# Patient Record
Sex: Female | Born: 1995 | Race: Black or African American | Hispanic: No | Marital: Single | Smoking: Never smoker
Health system: Southern US, Community
[De-identification: ages and names within clinical notes are randomized; demographics above are authoritative.]

## PROBLEM LIST (undated history)

## (undated) DIAGNOSIS — J189 Pneumonia, unspecified organism: Secondary | ICD-10-CM

## (undated) DIAGNOSIS — J438 Other emphysema: Secondary | ICD-10-CM

## (undated) DIAGNOSIS — M349 Systemic sclerosis, unspecified: Secondary | ICD-10-CM

## (undated) DIAGNOSIS — J449 Chronic obstructive pulmonary disease, unspecified: Secondary | ICD-10-CM

---
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
## 2019-11-01 ENCOUNTER — Other Ambulatory Visit: Payer: Self-pay

## 2019-11-01 ENCOUNTER — Inpatient Hospital Stay (HOSPITAL_COMMUNITY)
Admission: EM | Admit: 2019-11-01 | Discharge: 2019-11-14 | DRG: 871 | Disposition: A | Discharge status: Home / Self Care | Payer: BC Managed Care – PPO | Attending: Internal Medicine | Admitting: Internal Medicine

## 2019-11-01 ENCOUNTER — Emergency Department (HOSPITAL_COMMUNITY): Payer: BC Managed Care – PPO

## 2019-11-01 ENCOUNTER — Encounter (HOSPITAL_COMMUNITY): Payer: Self-pay | Admitting: Emergency Medicine

## 2019-11-01 DIAGNOSIS — K567 Ileus, unspecified: Secondary | ICD-10-CM

## 2019-11-01 DIAGNOSIS — Z6822 Body mass index (BMI) 22.0-22.9, adult: Secondary | ICD-10-CM

## 2019-11-01 DIAGNOSIS — E876 Hypokalemia: Secondary | ICD-10-CM | POA: Diagnosis not present

## 2019-11-01 DIAGNOSIS — N141 Nephropathy induced by other drugs, medicaments and biological substances: Secondary | ICD-10-CM | POA: Diagnosis not present

## 2019-11-01 DIAGNOSIS — Z8739 Personal history of other diseases of the musculoskeletal system and connective tissue: Secondary | ICD-10-CM

## 2019-11-01 DIAGNOSIS — T508X5A Adverse effect of diagnostic agents, initial encounter: Secondary | ICD-10-CM | POA: Diagnosis not present

## 2019-11-01 DIAGNOSIS — D638 Anemia in other chronic diseases classified elsewhere: Secondary | ICD-10-CM | POA: Diagnosis not present

## 2019-11-01 DIAGNOSIS — R109 Unspecified abdominal pain: Secondary | ICD-10-CM

## 2019-11-01 DIAGNOSIS — J438 Other emphysema: Secondary | ICD-10-CM | POA: Diagnosis present

## 2019-11-01 DIAGNOSIS — E875 Hyperkalemia: Secondary | ICD-10-CM | POA: Diagnosis not present

## 2019-11-01 DIAGNOSIS — B370 Candidal stomatitis: Secondary | ICD-10-CM | POA: Diagnosis not present

## 2019-11-01 DIAGNOSIS — Z20822 Contact with and (suspected) exposure to covid-19: Secondary | ICD-10-CM | POA: Diagnosis present

## 2019-11-01 DIAGNOSIS — I7300 Raynaud's syndrome without gangrene: Secondary | ICD-10-CM | POA: Diagnosis present

## 2019-11-01 DIAGNOSIS — M3481 Systemic sclerosis with lung involvement: Secondary | ICD-10-CM | POA: Diagnosis present

## 2019-11-01 DIAGNOSIS — R0902 Hypoxemia: Secondary | ICD-10-CM | POA: Diagnosis present

## 2019-11-01 DIAGNOSIS — E872 Acidosis: Secondary | ICD-10-CM | POA: Diagnosis not present

## 2019-11-01 DIAGNOSIS — R0602 Shortness of breath: Secondary | ICD-10-CM | POA: Diagnosis not present

## 2019-11-01 DIAGNOSIS — E440 Moderate protein-calorie malnutrition: Secondary | ICD-10-CM | POA: Diagnosis present

## 2019-11-01 DIAGNOSIS — E8770 Fluid overload, unspecified: Secondary | ICD-10-CM | POA: Diagnosis not present

## 2019-11-01 DIAGNOSIS — K224 Dyskinesia of esophagus: Secondary | ICD-10-CM | POA: Diagnosis not present

## 2019-11-01 DIAGNOSIS — N179 Acute kidney failure, unspecified: Secondary | ICD-10-CM

## 2019-11-01 DIAGNOSIS — A419 Sepsis, unspecified organism: Principal | ICD-10-CM | POA: Diagnosis present

## 2019-11-01 DIAGNOSIS — M349 Systemic sclerosis, unspecified: Secondary | ICD-10-CM | POA: Diagnosis present

## 2019-11-01 DIAGNOSIS — J852 Abscess of lung without pneumonia: Secondary | ICD-10-CM | POA: Diagnosis present

## 2019-11-01 DIAGNOSIS — J850 Gangrene and necrosis of lung: Secondary | ICD-10-CM | POA: Diagnosis present

## 2019-11-01 DIAGNOSIS — J851 Abscess of lung with pneumonia: Secondary | ICD-10-CM | POA: Diagnosis present

## 2019-11-01 HISTORY — DX: Pneumonia, unspecified organism: J18.9

## 2019-11-01 HISTORY — DX: Chronic obstructive pulmonary disease, unspecified: J44.9

## 2019-11-01 HISTORY — DX: Systemic sclerosis, unspecified: M34.9

## 2019-11-01 LAB — BASIC METABOLIC PANEL
Anion gap: 13 (ref 5–15)
BUN: 10 mg/dL (ref 6–20)
CO2: 21 mmol/L — ABNORMAL LOW (ref 22–32)
Calcium: 8.7 mg/dL — ABNORMAL LOW (ref 8.9–10.3)
Chloride: 104 mmol/L (ref 98–111)
Creatinine, Ser: 0.68 mg/dL (ref 0.44–1.00)
GFR calc Af Amer: >60 mL/min (ref >60)
GFR calc non Af Amer: >60 mL/min (ref >60)
Glucose, Bld: 88 mg/dL (ref 70–99)
Potassium: 3.8 mmol/L (ref 3.5–5.1)
Sodium: 138 mmol/L (ref 135–145)

## 2019-11-01 LAB — CBC
HCT: 34.7 % — ABNORMAL LOW (ref 36.0–46.0)
Hemoglobin: 11.1 g/dL — ABNORMAL LOW (ref 12.0–15.0)
MCH: 29.1 pg (ref 26.0–34.0)
MCHC: 32 g/dL (ref 30.0–36.0)
MCV: 91.1 fL (ref 80.0–100.0)
Platelets: 343 10*3/uL (ref 150–400)
RBC: 3.81 MIL/uL — ABNORMAL LOW (ref 3.87–5.11)
RDW: 14.1 % (ref 11.5–15.5)
WBC: 31.2 10*3/uL — ABNORMAL HIGH (ref 4.0–10.5)
nRBC: 0 % (ref 0.0–0.2)

## 2019-11-01 LAB — TROPONIN I (HIGH SENSITIVITY)
Troponin I (High Sensitivity): 18 ng/L — ABNORMAL HIGH (ref <18)
Troponin I (High Sensitivity): 43 ng/L — ABNORMAL HIGH (ref <18)

## 2019-11-01 LAB — I-STAT BETA HCG BLOOD, ED (MC, WL, AP ONLY): I-stat hCG, quantitative: 37.5 m[IU]/mL — ABNORMAL HIGH (ref <5)

## 2019-11-01 MED ORDER — HYDROCOD POLST-CPM POLST ER 10-8 MG/5ML PO SUER
5.0000 mL | Freq: Once | ORAL | Status: AC
  Administered 2019-11-01: 5 mL via ORAL
  Filled 2019-11-01: qty 5

## 2019-11-01 MED ORDER — SODIUM CHLORIDE 0.9% FLUSH: 3.0000 mL | Freq: Once | INTRAVENOUS | Status: DC

## 2019-11-01 NOTE — ED Triage Notes (Signed)
Pt in w/mom, c/o R sided lung pain and sob. Pt went to [facility] hospital 2 wks ago, had CT Angio done, and they found significant R lower lobe bullous emphysema. States past few days, pain is more unbearable, harder to breathe

## 2019-11-01 NOTE — ED Provider Notes (Signed)
[facility] EMERGENCY DEPARTMENT Provider Note   CSN: [id number] Arrival date & time: 11/01/19  1216     History Chief Complaint  Patient presents with  . Shortness of Breath  . Chest Pain    Ellen Skinner is a 24 y.o. female.  Patient with history of scleroderma presents with shortness of breath and cough since yesterday. 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 She developed a cough yesterday that has been persistent which prompted ED evaluation today. No fever, nausea, vomiting. No congestion. She is COVID vaccinated.   The history is provided by the patient. No language interpreter was used.  Shortness of Breath Associated symptoms: chest pain (Pleuritic)   Associated symptoms: no abdominal pain, no fever, no sore throat and no vomiting   Chest Pain Associated symptoms: shortness of breath   Associated symptoms: no abdominal pain, no fever, no nausea, no vomiting and no weakness        History reviewed. No pertinent past medical history.  There are no problems to display for this patient.   History reviewed. No pertinent surgical history.   OB History   No obstetric history on file.     No family history on file.  Social History   Tobacco Use  . Smoking status: Never Smoker  . Smokeless tobacco: Never Used  Substance Use Topics  . Alcohol use: Never  . Drug use: Never    Home Medications Prior to Admission medications   Not on File    Allergies    Patient has no allergy information on record.  Review of Systems   Review of Systems  Constitutional: Negative for chills and fever.  HENT: Negative.  Negative for congestion and sore throat.   Respiratory: Positive for shortness of breath.   Cardiovascular: Positive for chest pain (Pleuritic).  Gastrointestinal: Negative for abdominal pain, nausea and vomiting.  Musculoskeletal: Negative for myalgias.    Neurological: Negative for weakness and light-headedness.    Physical Exam Updated Vital Signs BP 117/66 (BP Location: Left Arm)   Pulse (!) 113   Temp 98.9 F (37.2 C) (Oral)   Resp 16   Wt 53.1 kg   LMP 10/05/2019   SpO2 96%   Physical Exam Vitals and nursing note reviewed.  Constitutional:      General: She is not in acute distress.    Appearance: She is well-developed.  HENT:     Head: Normocephalic.  Cardiovascular:     Rate and Rhythm: Regular rhythm. Tachycardia present.     Heart sounds: No murmur heard.   Pulmonary:     Effort: Pulmonary effort is normal.     Comments: Decreased air movement on right middle and lower lobes. Actively coughing with deep respirations. Chest:     Chest wall: No tenderness.  Abdominal:     Palpations: Abdomen is soft.     Tenderness: There is no abdominal tenderness.  Musculoskeletal:     Cervical back: Normal range of motion and neck supple.     Right lower leg: No edema.     Left lower leg: No edema.  Skin:    General: Skin is warm and dry.  Neurological:     General: No focal deficit present.     Mental Status: She is alert.     ED Results / Procedures / Treatments   Labs (all labs ordered are listed, but only abnormal results are displayed) Labs Reviewed  BASIC METABOLIC PANEL - Abnormal; Notable for the following components:      Result Value   CO2 21 (*)    Calcium 8.7 (*)    All other components within normal limits  CBC - Abnormal; Notable for the following components:   WBC 31.2 (*)    RBC 3.81 (*)    Hemoglobin 11.1 (*)    HCT 34.7 (*)    All other components within normal limits  I-STAT BETA HCG BLOOD, ED ([facility], [facility], [facility] ONLY) - Abnormal; Notable for the following components:   I-stat hCG, quantitative 37.5 (*)    All other components within normal limits  TROPONIN I (HIGH SENSITIVITY) - Abnormal; Notable for the following components:   Troponin I (High Sensitivity) 18 (*)    All other components within  normal limits  TROPONIN I (HIGH SENSITIVITY) - Abnormal; Notable for the following components:   Troponin I (High Sensitivity) 43 (*)    All other components within normal limits    EKG EKG Interpretation  Date/Time:  Monday November 01 2019 12:27:12 EDT Ventricular Rate:  114 PR Interval:  122 QRS Duration: 70 QT Interval:  316 QTC Calculation: 435 R Axis:   79 Text Interpretation: Sinus tachycardia Otherwise normal ECG No old tracing to compare Confirmed by Jacalyn Lefevre [phone]) on 11/01/2019 10:21:16 PM   Radiology DG Chest 2 View  Result Date: 11/01/2019 CLINICAL DATA:  Shortness of breath and chest pain EXAM: CHEST - 2 VIEW COMPARISON:  None. FINDINGS: Cardiac shadows within normal limits. The left lung is well aerated and clear. Right lung demonstrates a large cavitary lesion in the right lower lobe with air-fluid level identified. Some adjacent atelectatic changes are noted. No pneumothorax is seen. No bony abnormality is noted. IMPRESSION: Changes most consistent with a cavitary pneumonia/abscess. CT with contrast is recommended for further delineation. Electronically Signed   By: Alcide Clever M.D.   On: 11/01/2019 12:51    Procedures Procedures (including critical care time)  Medications Ordered in ED Medications  sodium chloride flush (NS) 0.9 % injection 3 mL (3 mLs Intravenous Not Given 11/01/19 2215)  chlorpheniramine-HYDROcodone (TUSSIONEX) 10-8 MG/5ML suspension 5 mL (has no administration in time range)    ED Course  I have reviewed the triage vital signs and the nursing notes.  Pertinent labs & imaging results that were available during my care of the patient were reviewed by me and considered in my medical decision making (see chart for details).    MDM Rules/Calculators/A&P                           Patient to ED with worsening SOB, new onset cough, recent diagnosis of bullous emphysema felt secondary to h/o scleroderma. She denies fever.   CXR showing  cavitary lesion. Broad spectrum antibiotics started (Vanc, Rocephin). Chart reviewed. Comparison to previous CXR raises concern for worsening condition. CT ordered and shows large cavitary process involving majority of the right lower lobe and most of the right middle lobe. There are multiple internal fluid levels within the cavitary process which appears to localize to the lung parenchyma as opposed to the pleural space. Suspect necrotizing pneumonia with probable pulmonary abscess. There is dense consolidation within the right middle and lower lobes.  Cough I simproved with Tussionex and Albuterol. She does desaturate while ambulating to 89%.   Given worsening condition/SOB, hypoxia on exertion, significant lung infection, likely necrotizing PNA, patient to be admitted for further  treatment and stabilization. Patient and parent updated.   Final Clinical Impression(s) / ED Diagnoses Final diagnoses:  None   1. Necrotizing PNA 2. Hypoxia   Rx / DC Orders ED Discharge Orders    None       Danne Harbor 11/02/19 0615    Jacalyn Lefevre, MD 11/03/19 [phone]

## 2019-11-02 ENCOUNTER — Encounter (HOSPITAL_COMMUNITY): Payer: Self-pay | Admitting: Internal Medicine

## 2019-11-02 ENCOUNTER — Emergency Department (HOSPITAL_COMMUNITY): Payer: BC Managed Care – PPO

## 2019-11-02 DIAGNOSIS — M349 Systemic sclerosis, unspecified: Secondary | ICD-10-CM | POA: Diagnosis present

## 2019-11-02 DIAGNOSIS — N179 Acute kidney failure, unspecified: Secondary | ICD-10-CM | POA: Diagnosis not present

## 2019-11-02 DIAGNOSIS — J851 Abscess of lung with pneumonia: Secondary | ICD-10-CM | POA: Diagnosis present

## 2019-11-02 DIAGNOSIS — K567 Ileus, unspecified: Secondary | ICD-10-CM | POA: Diagnosis not present

## 2019-11-02 DIAGNOSIS — I7300 Raynaud's syndrome without gangrene: Secondary | ICD-10-CM | POA: Diagnosis present

## 2019-11-02 DIAGNOSIS — T508X5A Adverse effect of diagnostic agents, initial encounter: Secondary | ICD-10-CM | POA: Diagnosis not present

## 2019-11-02 DIAGNOSIS — K224 Dyskinesia of esophagus: Secondary | ICD-10-CM | POA: Diagnosis not present

## 2019-11-02 DIAGNOSIS — E875 Hyperkalemia: Secondary | ICD-10-CM | POA: Diagnosis not present

## 2019-11-02 DIAGNOSIS — Z8739 Personal history of other diseases of the musculoskeletal system and connective tissue: Secondary | ICD-10-CM

## 2019-11-02 DIAGNOSIS — R0602 Shortness of breath: Secondary | ICD-10-CM | POA: Diagnosis present

## 2019-11-02 DIAGNOSIS — A419 Sepsis, unspecified organism: Secondary | ICD-10-CM | POA: Diagnosis present

## 2019-11-02 DIAGNOSIS — R0902 Hypoxemia: Secondary | ICD-10-CM | POA: Diagnosis present

## 2019-11-02 DIAGNOSIS — E872 Acidosis: Secondary | ICD-10-CM | POA: Diagnosis not present

## 2019-11-02 DIAGNOSIS — M3481 Systemic sclerosis with lung involvement: Secondary | ICD-10-CM | POA: Diagnosis present

## 2019-11-02 DIAGNOSIS — J850 Gangrene and necrosis of lung: Secondary | ICD-10-CM

## 2019-11-02 DIAGNOSIS — N141 Nephropathy induced by other drugs, medicaments and biological substances: Secondary | ICD-10-CM | POA: Diagnosis not present

## 2019-11-02 DIAGNOSIS — J438 Other emphysema: Secondary | ICD-10-CM

## 2019-11-02 DIAGNOSIS — E8770 Fluid overload, unspecified: Secondary | ICD-10-CM | POA: Diagnosis not present

## 2019-11-02 DIAGNOSIS — Z6822 Body mass index (BMI) 22.0-22.9, adult: Secondary | ICD-10-CM | POA: Diagnosis not present

## 2019-11-02 DIAGNOSIS — E440 Moderate protein-calorie malnutrition: Secondary | ICD-10-CM | POA: Diagnosis not present

## 2019-11-02 DIAGNOSIS — E876 Hypokalemia: Secondary | ICD-10-CM | POA: Diagnosis not present

## 2019-11-02 DIAGNOSIS — J189 Pneumonia, unspecified organism: Secondary | ICD-10-CM

## 2019-11-02 DIAGNOSIS — J852 Abscess of lung without pneumonia: Secondary | ICD-10-CM | POA: Diagnosis present

## 2019-11-02 DIAGNOSIS — D638 Anemia in other chronic diseases classified elsewhere: Secondary | ICD-10-CM | POA: Diagnosis not present

## 2019-11-02 DIAGNOSIS — Z20822 Contact with and (suspected) exposure to covid-19: Secondary | ICD-10-CM | POA: Diagnosis present

## 2019-11-02 DIAGNOSIS — B370 Candidal stomatitis: Secondary | ICD-10-CM | POA: Diagnosis not present

## 2019-11-02 LAB — HIV ANTIBODY (ROUTINE TESTING W REFLEX): HIV Screen 4th Generation wRfx: NONREACTIVE

## 2019-11-02 LAB — BASIC METABOLIC PANEL
Anion gap: 9 (ref 5–15)
BUN: 9 mg/dL (ref 6–20)
CO2: 22 mmol/L (ref 22–32)
Calcium: 8.3 mg/dL — ABNORMAL LOW (ref 8.9–10.3)
Chloride: 103 mmol/L (ref 98–111)
Creatinine, Ser: 0.76 mg/dL (ref 0.44–1.00)
GFR calc Af Amer: >60 mL/min (ref >60)
GFR calc non Af Amer: >60 mL/min (ref >60)
Glucose, Bld: 121 mg/dL — ABNORMAL HIGH (ref 70–99)
Potassium: 4.1 mmol/L (ref 3.5–5.1)
Sodium: 134 mmol/L — ABNORMAL LOW (ref 135–145)

## 2019-11-02 LAB — CBC
HCT: 34.2 % — ABNORMAL LOW (ref 36.0–46.0)
Hemoglobin: 10.9 g/dL — ABNORMAL LOW (ref 12.0–15.0)
MCH: 29.7 pg (ref 26.0–34.0)
MCHC: 31.9 g/dL (ref 30.0–36.0)
MCV: 93.2 fL (ref 80.0–100.0)
Platelets: 357 10*3/uL (ref 150–400)
RBC: 3.67 MIL/uL — ABNORMAL LOW (ref 3.87–5.11)
RDW: 14.1 % (ref 11.5–15.5)
WBC: 29.7 10*3/uL — ABNORMAL HIGH (ref 4.0–10.5)
nRBC: 0 % (ref 0.0–0.2)

## 2019-11-02 LAB — HCG, QUANTITATIVE, PREGNANCY: hCG, Beta Chain, Quant, S: 4 m[IU]/mL (ref <5)

## 2019-11-02 LAB — MRSA PCR SCREENING: MRSA by PCR: NEGATIVE

## 2019-11-02 LAB — EXPECTORATED SPUTUM ASSESSMENT W GRAM STAIN, RFLX TO RESP C

## 2019-11-02 LAB — SARS CORONAVIRUS 2 BY RT PCR (HOSPITAL ORDER, PERFORMED IN ~~LOC~~ HOSPITAL LAB): SARS Coronavirus 2: NEGATIVE

## 2019-11-02 LAB — LACTIC ACID, PLASMA: Lactic Acid, Venous: 1.7 mmol/L (ref 0.5–1.9)

## 2019-11-02 MED ORDER — ENOXAPARIN SODIUM 40 MG/0.4ML ~~LOC~~ SOLN
40.0000 mg | Freq: Every day | SUBCUTANEOUS | Status: DC
  Filled 2019-11-02: qty 0.4

## 2019-11-02 MED ORDER — ALBUTEROL SULFATE HFA 108 (90 BASE) MCG/ACT IN AERS
2.0000 | INHALATION_SPRAY | RESPIRATORY_TRACT | Status: DC | PRN
  Administered 2019-11-02: 2 via RESPIRATORY_TRACT
  Filled 2019-11-02: qty 6.7

## 2019-11-02 MED ORDER — PIPERACILLIN-TAZOBACTAM 3.375 G IVPB
3.3750 g | Freq: Three times a day (TID) | INTRAVENOUS | Status: DC
  Administered 2019-11-02 – 2019-11-03 (×4): 3.375 g via INTRAVENOUS
  Filled 2019-11-02 (×4): qty 50

## 2019-11-02 MED ORDER — ALBUTEROL SULFATE (2.5 MG/3ML) 0.083% IN NEBU: 2.5000 mg | INHALATION_SOLUTION | RESPIRATORY_TRACT | Status: DC | PRN

## 2019-11-02 MED ORDER — ONDANSETRON HCL 4 MG/2ML IJ SOLN
4.0000 mg | Freq: Four times a day (QID) | INTRAMUSCULAR | Status: DC | PRN
  Administered 2019-11-02 – 2019-11-09 (×11): 4 mg via INTRAVENOUS
  Filled 2019-11-02 (×11): qty 2

## 2019-11-02 MED ORDER — ACETAMINOPHEN 325 MG PO TABS
650.0000 mg | ORAL_TABLET | Freq: Four times a day (QID) | ORAL | Status: DC | PRN
  Administered 2019-11-06: 650 mg via ORAL
  Filled 2019-11-02 (×2): qty 2

## 2019-11-02 MED ORDER — VANCOMYCIN HCL IN DEXTROSE 1-5 GM/200ML-% IV SOLN
1000.0000 mg | Freq: Two times a day (BID) | INTRAVENOUS | Status: DC
  Administered 2019-11-02 (×2): 1000 mg via INTRAVENOUS
  Filled 2019-11-02 (×2): qty 200

## 2019-11-02 MED ORDER — KETOROLAC TROMETHAMINE 30 MG/ML IJ SOLN
30.0000 mg | Freq: Four times a day (QID) | INTRAMUSCULAR | Status: DC
  Administered 2019-11-02 – 2019-11-03 (×3): 30 mg via INTRAVENOUS
  Filled 2019-11-02 (×4): qty 1

## 2019-11-02 MED ORDER — HYDROCOD POLST-CPM POLST ER 10-8 MG/5ML PO SUER
5.0000 mL | Freq: Two times a day (BID) | ORAL | Status: DC
  Administered 2019-11-02 – 2019-11-13 (×22): 5 mL via ORAL
  Filled 2019-11-02 (×22): qty 5

## 2019-11-02 MED ORDER — IOHEXOL 350 MG/ML SOLN
60.0000 mL | Freq: Once | INTRAVENOUS | Status: AC | PRN
  Administered 2019-11-02: 60 mL via INTRAVENOUS

## 2019-11-02 MED ORDER — LACTATED RINGERS IV SOLN: INTRAVENOUS | Status: DC

## 2019-11-02 MED ORDER — SODIUM CHLORIDE 0.9 % IV SOLN
2.0000 g | Freq: Once | INTRAVENOUS | Status: AC
  Administered 2019-11-02: 2 g via INTRAVENOUS
  Filled 2019-11-02: qty 20

## 2019-11-02 MED ORDER — OXYCODONE-ACETAMINOPHEN 5-325 MG PO TABS
1.0000 | ORAL_TABLET | Freq: Four times a day (QID) | ORAL | Status: DC | PRN
  Administered 2019-11-02 – 2019-11-04 (×2): 2 via ORAL
  Administered 2019-11-05 – 2019-11-07 (×3): 1 via ORAL
  Administered 2019-11-07: 2 via ORAL
  Administered 2019-11-08: 1 via ORAL
  Filled 2019-11-02: qty 2
  Filled 2019-11-02 (×2): qty 1
  Filled 2019-11-02 (×3): qty 2
  Filled 2019-11-02: qty 1

## 2019-11-02 MED ORDER — VANCOMYCIN HCL IN DEXTROSE 1-5 GM/200ML-% IV SOLN
1000.0000 mg | Freq: Once | INTRAVENOUS | Status: AC
  Administered 2019-11-02: 1000 mg via INTRAVENOUS
  Filled 2019-11-02: qty 200

## 2019-11-02 MED ORDER — ENSURE ENLIVE PO LIQD
237.0000 mL | Freq: Two times a day (BID) | ORAL | Status: DC
  Administered 2019-11-03 – 2019-11-14 (×18): 237 mL via ORAL

## 2019-11-02 NOTE — Consult Note (Signed)
NAME:  Ellen Skinner, MRN:  [id number], DOB:  Apr 01, 1996, LOS: 0 ADMISSION DATE:  11/01/2019, CONSULTATION DATE:  11/02/19 REFERRING MD:  Tyson Babinski, CHIEF COMPLAINT:  Pleurisy   Brief History   24 year old woman with history of scleroderma presenting with abnormal CT chest.  History of present illness   24 year old woman with history of scleroderma not currently on therapy presenting for worsening right sided chest wall pain.  Started in mid-July, went to local [facility] where she was diagnosed with a symptomatic bullae.  Case was discussed with cardiothoracic surgeon there who was hesitant to offer lobectomy due to issues down the line with potentially needing lung transplant (lungs also showing significant paraseptal emphysema).  She was sent home with script for advil and prednisone taper.  She is visiting here for her brother's commencement ceremony, had worsening chest wall pain and fevers.  CT Chest here showing what appears to be necrotic lung.  She does have a cough but not really able to bring anything up.  No recent sick contacts.  She was on hydroxychloroquine up to the admission in mid-July.  No aspiration symptoms, no GI symptoms.  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  Denies Sicca, joint issues, DOE (until recently).   Significant Hospital Events   None  Consults:  None  Procedures:  None  Significant Diagnostic Tests:  CTA Chest IMPRESSION: 1. No definite CT evidence for acute pulmonary embolus. 2. Large cavitary process involving majority of the right lower lobe and most of the right middle lobe. There are multiple internal fluid levels within the cavitary process which appears to localize to the lung parenchyma as opposed to the pleural space. Suspect necrotizing pneumonia with probable pulmonary abscess. There is dense consolidation within the right middle and lower lobes. 3. The remaining  aerated lung parenchyma is abnormal with presence of emphysematous disease. Consider alpha-1 antitrypsin deficiency given patient age and presence of extensive emphysema. 4. Right hilar and mediastinal adenopathy is likely reactive  A1At 162, MM (ie does not have A1AT def)  Micro Data:  COVID neg  Antimicrobials:  Vanc 8/3>> Zosyn 8/3>>   Interim history/subjective:  Consulted.  Objective   Blood pressure 111/66, pulse 84, temperature (!) 101.9 F (38.8 C), temperature source Oral, resp. rate 16, height 5\' 1"  (1.549 m), weight 51.3 kg, last menstrual period 10/05/2019, SpO2 100 %.        Intake/Output Summary (Last 24 hours) at 11/02/2019 1104 Last data filed at 11/02/2019 1100 Gross per 24 hour  Intake 39.88 ml  Output --  Net 39.88 ml   Filed Weights   11/01/19 1231 11/01/19 2243  Weight: 53.1 kg 51.3 kg    Examination: General: thin woman in NAD HENT: MM dry malampatti 0 Lungs: Diminished R base, no wheezing, no accessory muscle use Cardiovascular: RRR, ext warm Abdomen: soft,+BS Extremities: no evidence of active arthritis Neuro: moves all 4 ext to command Psych: good insight, AOx3  Resolved Hospital Problem list   None  Assessment & Plan:  Infected bullae +/- necrotic lung +/- lung abscess- most of RLL and RML are gone.  Was immunosuppressed until recently.  No aspiration symptoms.  Warrants sampling. 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  Probably warrants step up in DMARD therapy as OP once infection resolves.  Reviewed imaging with Dr. 01/01/20 ([facility]). - NPO MN, bronch tomorrow 10AM with Dr. Cliffton Asters for usual bacterial/AFB/fungal cultures -  Standing toradol, PRN opiates - The best thing to do here after bronch may be prolonged augmentin course (~4 weeks) with OP f/u closer to home with her pulmonologist and rheumatologist.  I can call her with results from bronchoscopy.  If this fails to resolve, probably needs consideration for  resection.   Labs   CBC: Recent Labs  Lab 11/01/19 1239 11/02/19 0405  WBC 31.2* 29.7*  HGB 11.1* 10.9*  HCT 34.7* 34.2*  MCV 91.1 93.2  PLT 343 357    Basic Metabolic Panel: Recent Labs  Lab 11/01/19 1239 11/02/19 0405  NA 138 134*  K 3.8 4.1  CL 104 103  CO2 21* 22  GLUCOSE 88 121*  BUN 10 9  CREATININE 0.68 0.76  CALCIUM 8.7* 8.3*   GFR: Estimated Creatinine Clearance: 83.2 mL/min (by C-G formula based on SCr of 0.76 mg/dL). Recent Labs  Lab 11/01/19 1239 11/02/19 0405 11/02/19 0521  WBC 31.2* 29.7*  --   LATICACIDVEN  --   --  1.7    Liver Function Tests: No results for input(s): AST, ALT, ALKPHOS, BILITOT, PROT, ALBUMIN in the last 168 hours. No results for input(s): LIPASE, AMYLASE in the last 168 hours. No results for input(s): AMMONIA in the last 168 hours.  ABG No results found for: PHART, PCO2ART, PO2ART, HCO3, TCO2, ACIDBASEDEF, O2SAT   Coagulation Profile: No results for input(s): INR, PROTIME in the last 168 hours.  Cardiac Enzymes: No results for input(s): CKTOTAL, CKMB, CKMBINDEX, TROPONINI in the last 168 hours.  HbA1C: No results found for: HGBA1C  CBG: No results for input(s): GLUCAP in the last 168 hours.  Review of Systems:    Positive Symptoms in bold:  Constitutional fevers, chills, weight loss, fatigue, anorexia, malaise  Eyes decreased vision, double vision, eye irritation  Ears, Nose, Mouth, Throat sore throat, trouble swallowing, sinus congestion  Cardiovascular chest pain, paroxysmal nocturnal dyspnea, lower ext edema, palpitations   Respiratory SOB, cough, DOE, hemoptysis, wheezing  Gastrointestinal nausea, vomiting, diarrhea  Genitourinary burning with urination, trouble urinating  Musculoskeletal joint aches, joint swelling, back pain  Integumentary  rashes, skin lesions  Neurological focal weakness, focal numbness, trouble speaking, headaches  Psychiatric depression, anxiety, confusion  Endocrine polyuria,  polydipsia, cold intolerance, heat intolerance  Hematologic abnormal bruising, abnormal bleeding, unexplained nose bleeds  Allergic/Immunologic recurrent infections, hives, swollen lymph nodes     Past Medical History  She,  has a past medical history of Paraseptal emphysema (HCC) and Scleroderma involving lung (HCC).   Surgical History   History reviewed. No pertinent surgical history.   Social History   reports that she has never smoked. She has never used smokeless tobacco. She reports that she does not drink alcohol and does not use drugs.   Family History   Her family history is not on file.   Allergies No Known Allergies   Home Medications  Prior to Admission medications   Medication Sig Start Date End Date Taking? Authorizing Provider  ibuprofen (ADVIL) 200 MG tablet Take 600 mg by mouth every 6 (six) hours as needed for mild pain.   Yes [provider]  TRI-LO-MARZIA 0.18/0.215/0.25 MG-25 MCG tab Take 1 tablet by mouth daily. 05/08/19  Yes [provider]

## 2019-11-02 NOTE — Progress Notes (Signed)
Pharmacy Antibiotic Note  Ellen Skinner is a 24 y.o. female admitted on 11/01/2019 with pneumonia.  Pharmacy has been consulted for Vancomycin and Zosyn dosing.  Vancomycin 1 g IV given in ED at  0230  Plan: Vancomycin 1 g IV q12h Zosyn 3.375 g IV q8h   Height: 5\' 1"  (154.9 cm) Weight: 51.3 kg (113 lb) IBW/kg (Calculated) : 47.8  Temp (24hrs), Avg:99.6 F (37.6 C), Min:98.7 F (37.1 C), Max:102.1 F (38.9 C)  Recent Labs  Lab 11/01/19 1239  WBC 31.2*  CREATININE 0.68    Estimated Creatinine Clearance: 83.2 mL/min (by C-G formula based on SCr of 0.68 mg/dL).    No Known Allergies   01/01/20 11/02/2019 3:58 AM

## 2019-11-02 NOTE — Progress Notes (Signed)
Pt arrived to [address] 09. Accompanied with her mother. Pt identified appropriately. Alert and oriented x 4, VS stable, no signs of acute distress. Denied chest pain and SOB.  Cardiac monitor and continuous pulse ox in place. Pt oriented to room and equipment, instructed to call for assistance. Call bell left with in pt's reach.   Will continue to monitor pt and treat per MD orders.

## 2019-11-02 NOTE — Progress Notes (Signed)
Same day note  Patient seen and examined at bedside.  Patient was admitted to the hospital for cough, shortness of breath, chest pain  At the time of my evaluation, patient complains of repeated episodes of cough with chest pain, fever and chest pain.  Physical examination reveals average built female not in obvious distress.  Breath sounds heard bilaterally.  Diminished breath sounds right side of the lung  Laboratory data and imaging was reviewed  Assessment and Plan.  Right middle lobe lower lobe necrotizing pneumonia and pulmonary abscess with features of sepsis.  She did have significant leukocytosis, tachypnea and tachycardia, fever on presentation with lung abscess..  On vancomycin and Zosyn.  Spoke with pulmonary for consultation due to underlying emphysematous changes/scleroderma changes.  Continue supportive care with oxygen nebulizers, Tussionex.  HIV is nonreactive.  MRSA by PCR is negative.  Lactate of 1.7.  Blood cultures have been pending.  COVID-19 was negative.  History of scleroderma diagnosed at the age of 26.  Was briefly on some medication but she is not on for several years now.  Encouraged her to follow-up with pulmonary physician as outpatient after this acute episode.  CT scan of the chest that did showed some emphysematous changes.   No Charge  Signed,  Tenny Craw, MD [facility]

## 2019-11-02 NOTE — H&P (Addendum)
History and Physical    Ellen Skinner GNF:[id number] DOB: [birth year] DOA: 11/01/2019  PCP: System, Pcp Not In  Patient coming from: Home  I have personally briefly reviewed patient's old medical records [facility]  Chief Complaint: SOB, CP  HPI: Ellen Skinner is a 24 y.o. female with medical history significant of scleroderma and paraseptal emphysema.  These just diagnosed earlier this month, pt had just been referred to pulmonology in [place] (looks like [facility] from care everywhere).  Also looks like theres a Rheum referral too.  Hasnt had chance to see either yet, not yet on any meds / immuno suppresants.  Pt presents to ED today with SOB and cough since yesterday.  Cough has been persistent prompting ED eval today.  No fever (subjective), N/V.  Is COVID vaccinated.   ED Course: COVID neg.  CTA is neg for PE; CTA does show a large cavitary lesion partially fluid filled in RLL, RML representing pulmonary abscess, necrotizing PNA of RML, and RLL.  CTA also shows the emphysema findings.  WBC 31k.   Review of Systems: As per HPI, otherwise all review of systems negative.  Past Medical History:  Diagnosis Date  . Paraseptal emphysema (HCC)   . Scleroderma involving lung (HCC)     History reviewed. No pertinent surgical history.   reports that she has never smoked. She has never used smokeless tobacco. She reports that she does not drink alcohol and does not use drugs.  No Known Allergies  History reviewed. No pertinent family history. No sick contacts.  Prior to Admission medications   Medication Sig Start Date End Date Taking? Authorizing Provider  ibuprofen (ADVIL) 200 MG tablet Take 600 mg by mouth every 6 (six) hours as needed for mild pain.   Yes [provider]  TRI-LO-MARZIA 0.18/0.215/0.25 MG-25 MCG tab Take 1 tablet by mouth daily. 05/08/19  Yes [provider]    Physical Exam: Vitals:   11/01/19 2243 11/01/19 2300 11/02/19 0337  11/02/19 0339  BP:  118/73 118/62   Pulse:  (!) 101    Resp:  19  (!) 22  Temp:      TempSrc:      SpO2:  99%    Weight: 51.3 kg     Height: 5\' 1"  (1.549 m)       Constitutional: NAD, calm, comfortable Eyes: PERRL, lids and conjunctivae normal ENMT: Mucous membranes are moist. Posterior pharynx clear of any exudate or lesions.Normal dentition.  Neck: normal, supple, no masses, no thyromegaly Respiratory: Decreased RML, RLL Cardiovascular: Regular rate and rhythm, no murmurs / rubs / gallops. No extremity edema. 2+ pedal pulses. No carotid bruits.  Abdomen: no tenderness, no masses palpated. No hepatosplenomegaly. Bowel sounds positive.  Musculoskeletal: no clubbing / cyanosis. No joint deformity upper and lower extremities. Good ROM, no contractures. Normal muscle tone.  Skin: no rashes, lesions, ulcers. No induration Neurologic: CN 2-12 grossly intact. Sensation intact, DTR normal. Strength 5/5 in all 4.  Psychiatric: Normal judgment and insight. Alert and oriented x 3. Normal mood.    Labs on Admission: I have personally reviewed following labs and imaging studies  CBC: Recent Labs  Lab 11/01/19 1239  WBC 31.2*  HGB 11.1*  HCT 34.7*  MCV 91.1  PLT 343   Basic Metabolic Panel: Recent Labs  Lab 11/01/19 1239  NA 138  K 3.8  CL 104  CO2 21*  GLUCOSE 88  BUN 10  CREATININE 0.68  CALCIUM 8.7*   GFR:  Estimated Creatinine Clearance: 83.2 mL/min (by C-G formula based on SCr of 0.68 mg/dL). Liver Function Tests: No results for input(s): AST, ALT, ALKPHOS, BILITOT, PROT, ALBUMIN in the last 168 hours. No results for input(s): LIPASE, AMYLASE in the last 168 hours. No results for input(s): AMMONIA in the last 168 hours. Coagulation Profile: No results for input(s): INR, PROTIME in the last 168 hours. Cardiac Enzymes: No results for input(s): CKTOTAL, CKMB, CKMBINDEX, TROPONINI in the last 168 hours. BNP (last 3 results) No results for input(s): PROBNP in the last  8760 hours. HbA1C: No results for input(s): HGBA1C in the last 72 hours. CBG: No results for input(s): GLUCAP in the last 168 hours. Lipid Profile: No results for input(s): CHOL, HDL, LDLCALC, TRIG, CHOLHDL, LDLDIRECT in the last 72 hours. Thyroid Function Tests: No results for input(s): TSH, T4TOTAL, FREET4, T3FREE, THYROIDAB in the last 72 hours. Anemia Panel: No results for input(s): VITAMINB12, FOLATE, FERRITIN, TIBC, IRON, RETICCTPCT in the last 72 hours. Urine analysis: No results found for: COLORURINE, APPEARANCEUR, LABSPEC, PHURINE, GLUCOSEU, HGBUR, BILIRUBINUR, KETONESUR, PROTEINUR, UROBILINOGEN, NITRITE, LEUKOCYTESUR  Radiological Exams on Admission: DG Chest 2 View  Result Date: 11/01/2019 CLINICAL DATA:  Shortness of breath and chest pain EXAM: CHEST - 2 VIEW COMPARISON:  None. FINDINGS: Cardiac shadows within normal limits. The left lung is well aerated and clear. Right lung demonstrates a large cavitary lesion in the right lower lobe with air-fluid level identified. Some adjacent atelectatic changes are noted. No pneumothorax is seen. No bony abnormality is noted. IMPRESSION: Changes most consistent with a cavitary pneumonia/abscess. CT with contrast is recommended for further delineation. Electronically Signed   By: Alcide Clever M.D.   On: 11/01/2019 12:51   CT Angio Chest PE W and/or Wo Contrast  Result Date: 11/02/2019 CLINICAL DATA:  Chest pain EXAM: CT ANGIOGRAPHY CHEST WITH CONTRAST TECHNIQUE: Multidetector CT imaging of the chest was performed using the standard protocol during bolus administration of intravenous contrast. Multiplanar CT image reconstructions and MIPs were obtained to evaluate the vascular anatomy. CONTRAST:  65mL OMNIPAQUE IOHEXOL 350 MG/ML SOLN COMPARISON:  Chest x-ray 11/01/2019 FINDINGS: Cardiovascular: Satisfactory opacification of the pulmonary arteries to the segmental level. No definite acute pulmonary embolus is visualized. Hypoenhancement of distal  right pulmonary vessels due to lung consolidation and mass effect from large cavitary process. Aorta is nonaneurysmal. Cardiac size is normal. Mediastinum/Nodes: Midline trachea. No thyroid mass. Probable right hilar adenopathy measuring up to 12 mm. Subcarinal node measuring up to 15 mm. Lungs/Pleura: Underlying emphysematous disease throughout the lungs. Large cavitary process involving the majority of the right lower lobe and much of the right middle lobe with relative sparing of the right upper lobe. Consolidated lung parenchyma in the right middle and lower lobes. Multiple fluid levels are noted. Internal thickened septa within the right lower lobe cavitary process. There is probable small amount of right pleural effusion Upper Abdomen: No acute abnormality. Musculoskeletal: No chest wall abnormality. No acute or significant osseous findings. Review of the MIP images confirms the above findings. IMPRESSION: 1. No definite CT evidence for acute pulmonary embolus. 2. Large cavitary process involving majority of the right lower lobe and most of the right middle lobe. There are multiple internal fluid levels within the cavitary process which appears to localize to the lung parenchyma as opposed to the pleural space. Suspect necrotizing pneumonia with probable pulmonary abscess. There is dense consolidation within the right middle and lower lobes. 3. The remaining aerated lung parenchyma is abnormal with presence  of emphysematous disease. Consider alpha-1 antitrypsin deficiency given patient age and presence of extensive emphysema. 4. Right hilar and mediastinal adenopathy is likely reactive Electronically Signed   By: Jasmine Pang M.D.   On: 11/02/2019 03:00    EKG: Independently reviewed.  Assessment/Plan Principal Problem:   Necrotizing pneumonia (HCC) Active Problems:   Paraseptal emphysema (HCC)   Scleroderma involving lung (HCC)   Pulmonary abscess (HCC)    1. Necrotizing PNA and pulmonary  abscess - of RML and RLL 1. PNA pathway 2. Got rocephin and vanc in ED 3. Will put pt on empiric zosyn / vanc for now for necrotizing PNA + pulm abscess 4. MRSA PCR nares pending 5. Cont pulse ox and tele monitor 6. BCx and sputum Cx ordered (though ABx already on board) 7. Call CVTS in AM to see if they have anything to add 2. Sepsis - 1. Meets sepsis criteria on presentation (SIRS + Source) 2. Checking lactate 3. ABx as above 4. BCx ordered (though ABx already on board) 5. Not currently tachycardic nor hypotensive, will hold off on IVF bolus for the moment. 6. And just put her on LR at 75 for now. 3. Paraseptal emphysema - 1. Due to scleroderma apparently based on HPI / care everywhere.  Just diagnosed earlier this month. 2. Will go ahead and check an A1AT as recd by radiologist though 3. Looks like she has referrals in [place] system to establish care with pulm and rheum.  DVT prophylaxis: Lovenox Code Status: Full Family Communication: Mother at bedside Disposition Plan: Home after PNA / pulm abscess treated Consults called: None Admission status: Admit to inpatient  Severity of Illness: The appropriate patient status for this patient is INPATIENT. Inpatient status is judged to be reasonable and necessary in order to provide the required intensity of service to ensure the patient's safety. The patient's presenting symptoms, physical exam findings, and initial radiographic and laboratory data in the context of their chronic comorbidities is felt to place them at high risk for further clinical deterioration. Furthermore, it is not anticipated that the patient will be medically stable for discharge from the hospital within 2 midnights of admission. The following factors support the patient status of inpatient.   IP status due to necrotizing pneumonia with large pulmonary abscess.  * I certify that at the point of admission it is my clinical judgment that the patient will require  inpatient hospital care spanning beyond 2 midnights from the point of admission due to high intensity of service, high risk for further deterioration and high frequency of surveillance required.*    Tylyn Derwin M. DO [facility]  How to contact the [facility] Attending or Consulting provider 7A - 7P or covering provider during after hours 7P -7A, for this patient?  1. Check the care team in [facility] and look for a) attending/consulting [facility] provider listed and b) the [facility] team listed 2. Log into [email or web address]  [employer] Physician Scheduling and messaging for groups and whole hospitals  On call and physician scheduling software for group practices, residents, hospitalists and other medical providers for call, clinic, rotation and shift schedules. On[employer] is a hospital-wide system for scheduling doctors and paging doctors on call. EasyPlot is for scientific plotting and data analysis.  [email or web address]  and use [place]'s universal password to access. If you do not have the password, please contact the hospital operator.  3. Locate the [facility] provider you are looking for under [facility] and page to a number that you  can be directly reached. 4. If you still have difficulty reaching the provider, please page the [facility] (Director on Call) for the Hospitalists listed on amion for assistance.  11/02/2019, 4:42 AM

## 2019-11-02 NOTE — Progress Notes (Signed)
Pt c/o nausea, emesis x 1, unable to eat and drink. MD notified.

## 2019-11-02 NOTE — ED Notes (Signed)
Patient walked around nurses station. Oxygen hard to obtain d/t patients fingers being cold but the last reading was 89% RA. Upoln entering room patient 92% RA. [id number]. RR27. After two minutes of rest 96%RA and respirations 23. HR 105

## 2019-11-02 NOTE — ED Notes (Signed)
MD paged regarding fever and request for pain medication.

## 2019-11-03 ENCOUNTER — Inpatient Hospital Stay (HOSPITAL_COMMUNITY): Payer: BC Managed Care – PPO | Admitting: Certified Registered"

## 2019-11-03 ENCOUNTER — Encounter (HOSPITAL_COMMUNITY)
Admission: EM | Disposition: A | Discharge status: Home / Self Care | Payer: Self-pay | Source: Home / Self Care | Attending: Family Medicine

## 2019-11-03 ENCOUNTER — Encounter (HOSPITAL_COMMUNITY): Payer: Self-pay | Admitting: Internal Medicine

## 2019-11-03 DIAGNOSIS — J850 Gangrene and necrosis of lung: Secondary | ICD-10-CM | POA: Diagnosis not present

## 2019-11-03 DIAGNOSIS — J851 Abscess of lung with pneumonia: Secondary | ICD-10-CM | POA: Diagnosis not present

## 2019-11-03 DIAGNOSIS — M349 Systemic sclerosis, unspecified: Secondary | ICD-10-CM | POA: Diagnosis not present

## 2019-11-03 HISTORY — PX: VIDEO BRONCHOSCOPY: SHX5072

## 2019-11-03 HISTORY — PX: BRONCHIAL WASHINGS: SHX5105

## 2019-11-03 LAB — CBC
HCT: 33.7 % — ABNORMAL LOW (ref 36.0–46.0)
Hemoglobin: 10.7 g/dL — ABNORMAL LOW (ref 12.0–15.0)
MCH: 28.9 pg (ref 26.0–34.0)
MCHC: 31.8 g/dL (ref 30.0–36.0)
MCV: 91.1 fL (ref 80.0–100.0)
Platelets: 342 10*3/uL (ref 150–400)
RBC: 3.7 MIL/uL — ABNORMAL LOW (ref 3.87–5.11)
RDW: 14 % (ref 11.5–15.5)
WBC: 34.2 10*3/uL — ABNORMAL HIGH (ref 4.0–10.5)
nRBC: 0 % (ref 0.0–0.2)

## 2019-11-03 LAB — URINALYSIS, ROUTINE W REFLEX MICROSCOPIC
Bilirubin Urine: NEGATIVE
Glucose, UA: NEGATIVE mg/dL
Hgb urine dipstick: NEGATIVE
Ketones, ur: NEGATIVE mg/dL
Leukocytes,Ua: NEGATIVE
Nitrite: NEGATIVE
Protein, ur: 100 mg/dL — AB
Specific Gravity, Urine: 1.018 (ref 1.005–1.030)
pH: 5 (ref 5.0–8.0)

## 2019-11-03 LAB — APTT: aPTT: 41 seconds — ABNORMAL HIGH (ref 24–36)

## 2019-11-03 LAB — COMPREHENSIVE METABOLIC PANEL
ALT: 26 U/L (ref 0–44)
AST: 23 U/L (ref 15–41)
Albumin: 1.9 g/dL — ABNORMAL LOW (ref 3.5–5.0)
Alkaline Phosphatase: 165 U/L — ABNORMAL HIGH (ref 38–126)
Anion gap: 15 (ref 5–15)
BUN: 17 mg/dL (ref 6–20)
CO2: 17 mmol/L — ABNORMAL LOW (ref 22–32)
Calcium: 8.5 mg/dL — ABNORMAL LOW (ref 8.9–10.3)
Chloride: 102 mmol/L (ref 98–111)
Creatinine, Ser: 2.6 mg/dL — ABNORMAL HIGH (ref 0.44–1.00)
GFR calc Af Amer: 29 mL/min — ABNORMAL LOW (ref >60)
GFR calc non Af Amer: 25 mL/min — ABNORMAL LOW (ref >60)
Glucose, Bld: 95 mg/dL (ref 70–99)
Potassium: 4.8 mmol/L (ref 3.5–5.1)
Sodium: 134 mmol/L — ABNORMAL LOW (ref 135–145)
Total Bilirubin: 1.6 mg/dL — ABNORMAL HIGH (ref 0.3–1.2)
Total Protein: 7.7 g/dL (ref 6.5–8.1)

## 2019-11-03 LAB — ALPHA-1 ANTITRYPSIN PHENOTYPE: A-1 Antitrypsin, Ser: 338 mg/dL — ABNORMAL HIGH (ref 100–188)

## 2019-11-03 LAB — PROTIME-INR
INR: 1.3 — ABNORMAL HIGH (ref 0.8–1.2)
Prothrombin Time: 15.6 seconds — ABNORMAL HIGH (ref 11.4–15.2)

## 2019-11-03 LAB — ABO/RH: ABO/RH(D): O POS

## 2019-11-03 LAB — CREATININE, URINE, RANDOM: Creatinine, Urine: 133.58 mg/dL

## 2019-11-03 LAB — SODIUM, URINE, RANDOM: Sodium, Ur: 21 mmol/L

## 2019-11-03 LAB — TYPE AND SCREEN
ABO/RH(D): O POS
Antibody Screen: NEGATIVE

## 2019-11-03 SURGERY — VIDEO BRONCHOSCOPY WITHOUT FLUORO
Anesthesia: General | Laterality: Right

## 2019-11-03 MED ORDER — SODIUM CHLORIDE 0.9 % IV SOLN: INTRAVENOUS | Status: DC

## 2019-11-03 MED ORDER — FENTANYL CITRATE (PF) 250 MCG/5ML IJ SOLN
INTRAMUSCULAR | Status: DC | PRN
  Administered 2019-11-03 (×2): 25 ug via INTRAVENOUS

## 2019-11-03 MED ORDER — SODIUM CHLORIDE 0.9 % IV BOLUS
500.0000 mL | Freq: Once | INTRAVENOUS | Status: AC
  Administered 2019-11-03: 500 mL via INTRAVENOUS

## 2019-11-03 MED ORDER — DEXAMETHASONE SODIUM PHOSPHATE 10 MG/ML IJ SOLN
INTRAMUSCULAR | Status: DC | PRN
  Administered 2019-11-03: 4 mg via INTRAVENOUS

## 2019-11-03 MED ORDER — SODIUM CHLORIDE 0.9 % IV SOLN
1.5000 g | Freq: Two times a day (BID) | INTRAVENOUS | Status: DC
  Administered 2019-11-03 – 2019-11-04 (×2): 1.5 g via INTRAVENOUS
  Filled 2019-11-03 (×2): qty 4
  Filled 2019-11-03: qty 1.5

## 2019-11-03 MED ORDER — LACTATED RINGERS IV SOLN: INTRAVENOUS | Status: DC | PRN

## 2019-11-03 MED ORDER — ONDANSETRON HCL 4 MG/2ML IJ SOLN
INTRAMUSCULAR | Status: DC | PRN
  Administered 2019-11-03: 4 mg via INTRAVENOUS

## 2019-11-03 MED ORDER — MIDAZOLAM HCL 2 MG/2ML IJ SOLN
INTRAMUSCULAR | Status: DC | PRN
  Administered 2019-11-03 (×2): 1 mg via INTRAVENOUS

## 2019-11-03 MED ORDER — PROPOFOL 10 MG/ML IV BOLUS
INTRAVENOUS | Status: DC | PRN
  Administered 2019-11-03: 100 mg via INTRAVENOUS

## 2019-11-03 MED ORDER — LIDOCAINE 2% (20 MG/ML) 5 ML SYRINGE
INTRAMUSCULAR | Status: DC | PRN
  Administered 2019-11-03: 60 mg via INTRAVENOUS

## 2019-11-03 MED ORDER — LIDOCAINE HCL (PF) 1 % IJ SOLN
INTRAMUSCULAR | Status: AC
  Filled 2019-11-03: qty 30

## 2019-11-03 MED ORDER — LIDOCAINE HCL (PF) 1 % IJ SOLN
INTRAMUSCULAR | Status: DC | PRN
  Administered 2019-11-03: 15 mL

## 2019-11-03 MED ORDER — ENOXAPARIN SODIUM 30 MG/0.3ML ~~LOC~~ SOLN
30.0000 mg | Freq: Every day | SUBCUTANEOUS | Status: DC
  Administered 2019-11-03: 30 mg via SUBCUTANEOUS
  Filled 2019-11-03: qty 0.3

## 2019-11-03 NOTE — Anesthesia Procedure Notes (Signed)
Procedure Name: LMA Insertion Date/Time: 11/03/2019 10:29 AM Performed by: De Nurse, CRNA Pre-anesthesia Checklist: Patient identified, Emergency Drugs available, Suction available and Patient being monitored Patient Re-evaluated:Patient Re-evaluated prior to induction Oxygen Delivery Method: Circle System Utilized Preoxygenation: Pre-oxygenation with 100% oxygen Induction Type: IV induction Ventilation: Mask ventilation without difficulty LMA: LMA inserted LMA Size: 4.0 Number of attempts: 1 Placement Confirmation: positive ETCO2 Tube secured with: Tape Dental Injury: Teeth and Oropharynx as per pre-operative assessment

## 2019-11-03 NOTE — Anesthesia Postprocedure Evaluation (Signed)
Anesthesia Post Note  Patient: Ellen Skinner  Procedure(s) Performed: VIDEO BRONCHOSCOPY WITHOUT FLUORO (Right ) BRONCHIAL WASHINGS     Patient location during evaluation: PACU Anesthesia Type: General Level of consciousness: awake and alert Pain management: pain level controlled Vital Signs Assessment: post-procedure vital signs reviewed and stable Respiratory status: spontaneous breathing, nonlabored ventilation and respiratory function stable Cardiovascular status: blood pressure returned to baseline and stable Postop Assessment: no apparent nausea or vomiting Anesthetic complications: no   No complications documented.  Last Vitals:  Vitals:   11/03/19 1100 11/03/19 1115  BP: 131/67 128/76  Pulse: (!) 121   Resp: (!) 34 (!) 26  Temp:    SpO2: 98% 100%    Last Pain:  Vitals:   11/03/19 1115  TempSrc:   PainSc: 0-No pain                 Zarius Furr,W. EDMOND

## 2019-11-03 NOTE — Transfer of Care (Signed)
Immediate Anesthesia Transfer of Care Note  Patient: Ellen Skinner  Procedure(s) Performed: VIDEO BRONCHOSCOPY WITHOUT FLUORO (Right )  Patient Location: Endoscopy Unit  Anesthesia Type:General  Level of Consciousness: lethargic and responds to stimulation  Airway & Oxygen Therapy: Patient Spontanous Breathing  Post-op Assessment: Report given to RN  Post vital signs: Reviewed and stable  Last Vitals:  Vitals Value Taken Time  BP    Temp    Pulse 114 11/03/19 1050  Resp 33 11/03/19 1050  SpO2 96 % 11/03/19 1050  Vitals shown include unvalidated device data.  Last Pain:  Vitals:   11/03/19 0940  TempSrc: Axillary  PainSc: 0-No pain         Complications: No complications documented.

## 2019-11-03 NOTE — Progress Notes (Signed)
Initial Nutrition Assessment  DOCUMENTATION CODES:   Not applicable  INTERVENTION:  Continue Ensure Enlive po BID, each supplement provides 350 kcal and 20 grams of protein  Magic cup TID with meals, each supplement provides 290 kcal and 9 grams of protein   NUTRITION DIAGNOSIS:   Predicted suboptimal nutrient intake related to decreased appetite as evidenced by other (comment) (per MST report).    GOAL:   Patient will meet greater than or equal to 90% of their needs    MONITOR:   PO intake, Supplement acceptance, Labs, Weight trends, I & O's  REASON FOR ASSESSMENT:   Malnutrition Screening Tool    ASSESSMENT:   Pt admitted with infected bullae +/- necrotizing PNA +/- pulmonary abscess of RML and RLL. PMH includes scleroderma  and paraseptal emphysema.   Pt unavailable at time of RD visit. Bronchoscopy being performed.   No wt hx available for review.   No PO intake documented.   Labs: Na 134 (L) Medications: Ensure Enlive BID, IV abx  NUTRITION - FOCUSED PHYSICAL EXAM:  Unable to perform at this time, will attempt at follow-up.   Diet Order:   Diet Order            Diet NPO time specified  Diet effective now                 EDUCATION NEEDS:   No education needs have been identified at this time  Skin:  Skin Assessment: Reviewed RN Assessment  Last BM:  8/2  Height:   Ht Readings from Last 1 Encounters:  11/01/19 5\' 1"  (1.549 m)    Weight:   Wt Readings from Last 1 Encounters:  11/01/19 51.3 kg    BMI:  Body mass index is 21.35 kg/m.  Estimated Nutritional Needs:   Kcal:  1550-1750  Protein:  75-85 grams  Fluid:  >/= 1.5L/d    01/01/20, MS, RD, LDN RD pager number and weekend/on-call pager number located in Amion.

## 2019-11-03 NOTE — Anesthesia Preprocedure Evaluation (Signed)
Anesthesia Evaluation  Patient identified by MRN, date of birth, ID band Patient awake    Reviewed: Allergy & Precautions, H&P , NPO status , Patient's Chart, lab work & pertinent test results  Airway Mallampati: II  TM Distance: >3 FB Neck ROM: Full    Dental no notable dental hx. (+) Teeth Intact, Dental Advisory Given   Pulmonary COPD,    Pulmonary exam normal breath sounds clear to auscultation       Cardiovascular negative cardio ROS   Rhythm:Regular Rate:Normal     Neuro/Psych negative neurological ROS  negative psych ROS   GI/Hepatic negative GI ROS, Neg liver ROS,   Endo/Other  negative endocrine ROS  Renal/GU negative Renal ROS  negative genitourinary   Musculoskeletal   Abdominal   Peds  Hematology negative hematology ROS (+)   Anesthesia Other Findings   Reproductive/Obstetrics negative OB ROS                             Anesthesia Physical Anesthesia Plan  ASA: II  Anesthesia Plan: General   Post-op Pain Management:    Induction: Intravenous  PONV Risk Score and Plan: 4 or greater and Ondansetron, Dexamethasone and Midazolam  Airway Management Planned: Oral ETT  Additional Equipment:   Intra-op Plan:   Post-operative Plan: Extubation in OR  Informed Consent: I have reviewed the patients History and Physical, chart, labs and discussed the procedure including the risks, benefits and alternatives for the proposed anesthesia with the patient or authorized representative who has indicated his/her understanding and acceptance.     Dental advisory given  Plan Discussed with: CRNA  Anesthesia Plan Comments:         Anesthesia Quick Evaluation

## 2019-11-03 NOTE — Op Note (Signed)
[facility] Cardiopulmonary Patient Name: Ellen Skinner Date: 11/03/2019 MRN: [id number] Attending MD: Collene Gobble , MD Date of Birth: 11-Apr-1995 CSN: Finalized Age: 24 Admit Type: Inpatient Gender: Female Procedure:             Bronchoscopy Indications:           Right upper lobe pneumonia, Right middle lobe                         pneumonia, Right lower lobe pneumonia Providers:             Collene Gobble, MD, Carlyn Reichert, RN, Lazaro Arms,                         Technician, Tyrone Apple, Technician, Sampson Si, CRNA Referring MD:           Medicines:             Monitored Anesthesia Care Complications:         No immediate complications Estimated Blood Loss:  Estimated blood loss: none. Procedure:             Pre-Anesthesia Assessment:                        - A History and Physical has been performed. Patient                         meds and allergies have been reviewed. The risks and                         benefits of the procedure and the sedation options and                         risks were discussed with the patient. All questions                         were answered and informed consent was obtained.                         Patient identification and proposed procedure were                         verified prior to the procedure by the physician in                         the pre-procedure area. Mental Status Examination:                         alert and oriented. Airway Examination: small/crowded                         oropharyngeal airway. Respiratory Examination: poor                         air movement in the right lung. CV Examination:  normal. ASA Grade Assessment: III - A patient with                         severe systemic disease. After reviewing the risks and                         benefits, the patient was deemed in satisfactory                         condition to undergo the  procedure. The anesthesia                         plan was to use monitored anesthesia care (MAC).                         Immediately prior to administration of medications,                         the patient was re-assessed for adequacy to receive                         sedatives. The heart rate, respiratory rate, oxygen                         saturations, blood pressure, adequacy of pulmonary                         ventilation, and response to care were monitored                         throughout the procedure. The physical status of the                         patient was re-assessed after the procedure.                        After obtaining informed consent, the bronchoscope was                         passed under direct vision. Throughout the procedure,                         the patient's blood pressure, pulse, and oxygen                         saturations were monitored continuously. the [id number]                         ([id number]) Olympus Therapeutic Bronchoscope was                         introduced through the mouth, via laryngeal mask                         airway and advanced to the tracheobronchial tree. The                         procedure was accomplished without difficulty. The  patient tolerated the procedure well. Scope In: Scope Out: Findings:      The laryngeal mask airway is in good position. The vocal cords appear       normal. The subglottic space is normal. The trachea is of normal       caliber. The carina is sharp. The tracheobronchial tree was examined to       at least the first subsegmental level. Bronchial mucosa and anatomy are       normal; there are no endobronchial lesions, and no secretions.      Bronchoalveolar lavage was performed in the right middle lobe of the       lung and sent for cell count, bacterial culture, and fungal & AFB       analysis. 60 mL of fluid were instilled. 28 mL were returned. The return        was clear. There were no mucoid plugs in the return fluid. Impression:            - Right upper lobe pneumonia                        - Right middle lobe pneumonia                        - Right lower lobe pneumonia                        - The airway examination was normal.                        - Bronchoalveolar lavage was performed in the right                         lower lobe to be sent for culture data. Moderate Sedation:      Procedure was performed under MAC. Recommendation:        - Await culture results. Procedure Code(s):     --- Professional ---                        [phone], Bronchoscopy, rigid or flexible, including                         fluoroscopic guidance, when performed; with bronchial                         alveolar lavage Diagnosis Code(s):     --- Professional ---                        J18.1, Lobar pneumonia, unspecified organism CPT copyright 2019 American Medical Association. All rights reserved. The codes documented in this report are preliminary and upon coder review may  be revised to meet current compliance requirements. Collene Gobble, MD Collene Gobble, MD 11/03/2019 10:55:37 AM Number of Addenda: 0

## 2019-11-03 NOTE — Progress Notes (Signed)
PROGRESS NOTE    Ellen Skinner  QMG:[id number] DOB: December 31, 1995 DOA: 11/01/2019 PCP: System, Pcp Not In      Brief Narrative:  Ms Goding is a 24 y.o. F with recently diagnosed scleroderma and emphysema presented with cough and shortness of breath, found to have an infected pulmonary bulla.       Assessment & Plan:  Suspected infected bulla possibly necrotic lung, possibly lung abscess -Narrow antibiotics to Unasyn -Consult pulmonology, appreciate expert cares  Acute renal failure Creatinine up to 2.6 today from baseline less than 1.  This is in the context of recent contrast, Toradol, and Vanco Zosyn.  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 -Stop Vanco and Zosyn -Stop NSAIDs -Start IV fluids -Monitor renal output -Check urine electrolytes and urinalysis  Scleroderma  Emphysema   Anemia, mild     Disposition: Status is: Inpatient  Remains inpatient appropriate because:Persistent severe electrolyte disturbances   Dispo:  Patient From: Home  Planned Disposition: Home with Health Care Svc  Expected discharge date: 11/05/19  Medically stable for discharge: No               MDM: The below labs and imaging reports were reviewed and summarized above.  Medication management as above.    DVT prophylaxis: enoxaparin (LOVENOX) injection 30 mg Start: 11/03/19 2200  Code Status: FULL Family Communication: mohter    Consultants:   Pulmonology  Procedures:     Antimicrobials:      Culture data:              Subjective: normal  Objective: Vitals:   11/02/19 2315 11/02/19 2322 11/03/19 0020 11/03/19 0336  BP: (!) 82/65 119/60 128/70 107/71  Pulse: 87   78  Resp: 18   18  Temp: 99.2 F (37.3 C)   97.7 F (36.5 C)  TempSrc: Oral   Oral  SpO2: 95%  97% 96%  Weight:      Height:        Intake/Output Summary (Last 24 hours) at 11/03/2019 0836 Last data filed at 11/03/2019 0422 Gross per 24 hour  Intake 1003.53 ml  Output 200  ml  Net 803.53 ml   Filed Weights   11/01/19 1231 11/01/19 2243  Weight: 53.1 kg 51.3 kg    Examination: General appearance: Thin adult female, alert and in no acute distress.   HEENT: Anicteric, conjunctiva pink, lids and lashes normal. No nasal deformity, discharge, epistaxis.  Lips moist.   Skin: Warm and dry.  No jaundice.  No suspicious rashes or lesions. Cardiac: RRR, nl S1-S2, no murmurs appreciated.  Capillary refill is brisk.  JVP normal.  No LE edema.  Radial  pulses 2+ and symmetric. Respiratory: Normal respiratory rate and rhythm.  CTAB without rales or wheezes. Abdomen: Abdomen soft.  No TTP or guarding. No ascites, distension, hepatosplenomegaly.   MSK: No deformities or effusions. Neuro: Awake and alert.  EOMI, moves all extremities. Speech fluent.    Psych: Sensorium intact and responding to questions, attention normal. Affect normal.  Judgment and insight appear normal.    Data Reviewed: I have personally reviewed following labs and imaging studies:  CBC: Recent Labs  Lab 11/01/19 1239 11/02/19 0405 11/03/19 0111  WBC 31.2* 29.7* 34.2*  HGB 11.1* 10.9* 10.7*  HCT 34.7* 34.2* 33.7*  MCV 91.1 93.2 91.1  PLT 343 357 342   Basic Metabolic Panel: Recent Labs  Lab 11/01/19 1239 11/02/19 0405 11/03/19 0111  NA 138 134* 134*  K 3.8 4.1  4.8  CL 104 103 102  CO2 21* 22 17*  GLUCOSE 88 121* 95  BUN 10 9 17   CREATININE 0.68 0.76 2.60*  CALCIUM 8.7* 8.3* 8.5*   GFR: Estimated Creatinine Clearance: 25.6 mL/min (A) (by C-G formula based on SCr of 2.6 mg/dL (H)). Liver Function Tests: Recent Labs  Lab 11/03/19 0111  AST 23  ALT 26  ALKPHOS 165*  BILITOT 1.6*  PROT 7.7  ALBUMIN 1.9*   No results for input(s): LIPASE, AMYLASE in the last 168 hours. No results for input(s): AMMONIA in the last 168 hours. Coagulation Profile: Recent Labs  Lab 11/03/19 0111  INR 1.3*   Cardiac Enzymes: No results for input(s): CKTOTAL, CKMB, CKMBINDEX, TROPONINI in  the last 168 hours. BNP (last 3 results) No results for input(s): PROBNP in the last 8760 hours. HbA1C: No results for input(s): HGBA1C in the last 72 hours. CBG: No results for input(s): GLUCAP in the last 168 hours. Lipid Profile: No results for input(s): CHOL, HDL, LDLCALC, TRIG, CHOLHDL, LDLDIRECT in the last 72 hours. Thyroid Function Tests: No results for input(s): TSH, T4TOTAL, FREET4, T3FREE, THYROIDAB in the last 72 hours. Anemia Panel: No results for input(s): VITAMINB12, FOLATE, FERRITIN, TIBC, IRON, RETICCTPCT in the last 72 hours. Urine analysis: No results found for: COLORURINE, APPEARANCEUR, LABSPEC, PHURINE, GLUCOSEU, HGBUR, BILIRUBINUR, KETONESUR, PROTEINUR, UROBILINOGEN, NITRITE, LEUKOCYTESUR Sepsis Labs: @LABRCNTIP (procalcitonin:4,lacticacidven:4)  ) Recent Results (from the past 240 hour(s))  SARS Coronavirus 2 by RT PCR (hospital order, performed in [facility] hospital lab) Nasopharyngeal Nasopharyngeal Swab     Status: None   Collection Time: 11/02/19  2:48 AM   Specimen: Nasopharyngeal Swab  Result Value Ref Range Status   SARS Coronavirus 2 NEGATIVE NEGATIVE Final    Comment: (NOTE) SARS-CoV-2 target nucleic acids are NOT DETECTED.  The SARS-CoV-2 RNA is generally detectable in upper and lower respiratory specimens during the acute phase of infection. The lowest concentration of SARS-CoV-2 viral copies this assay can detect is 250 copies / mL. A negative result does not preclude SARS-CoV-2 infection and should not be used as the sole basis for treatment or other patient management decisions.  A negative result may occur with improper specimen collection / handling, submission of specimen other than nasopharyngeal swab, presence of viral mutation(s) within the areas targeted by this assay, and inadequate number of viral copies (<250 copies / mL). A negative result must be combined with clinical observations, patient history, and epidemiological  information.  Fact Sheet for Patients:   [facility]  Fact Sheet for Healthcare Providers: 01/02/20  This test is not yet approved or  cleared by the [employer] FDA and has been authorized for detection and/or diagnosis of SARS-CoV-2 by FDA under an Emergency Use Authorization (EUA).  This EUA will remain in effect (meaning this test can be used) for the duration of the COVID-19 declaration under Section 564(b)(1) of the Act, 21 U.S.C. section 360bbb-3(b)(1), unless the authorization is terminated or revoked sooner.  Performed at [facility], [address] [place], [address] [place]   MRSA PCR Screening     Status: None   Collection Time: 11/02/19  4:05 AM   Specimen: Nasopharyngeal  Result Value Ref Range Status   MRSA by PCR NEGATIVE NEGATIVE Final    Comment:        The GeneXpert MRSA Assay (FDA approved for NASAL specimens only), is one component of a comprehensive MRSA colonization surveillance program. It is not intended to diagnose MRSA infection nor  to guide or monitor treatment for MRSA infections. Performed at [facility], [address], [place], [place] [address]   Culture, blood (routine x 2) Call MD if unable to obtain prior to antibiotics being given     Status: None (Preliminary result)   Collection Time: 11/02/19  4:40 AM   Specimen: BLOOD  Result Value Ref Range Status   Specimen Description BLOOD RIGHT ARM  Final   Special Requests   Final    BOTTLES DRAWN AEROBIC AND ANAEROBIC Blood Culture results may not be optimal due to an inadequate volume of blood received in culture bottles   Culture   Final    NO GROWTH 1 DAY Performed at [facility], [address], [place], [place] [address]    Report Status PENDING  Incomplete  Culture, sputum-assessment     Status: None   Collection Time: 11/02/19  8:31 AM   Specimen: Expectorated Sputum  Result Value Ref Range  Status   Specimen Description Expect. Sput  Final   Special Requests NONE  Final   Sputum evaluation   Final    THIS SPECIMEN IS ACCEPTABLE FOR SPUTUM CULTURE Performed at [facility], [address] [place], [place] [address]    Report Status 11/02/2019 FINAL  Final  Culture, respiratory     Status: None (Preliminary result)   Collection Time: 11/02/19  8:31 AM  Result Value Ref Range Status   Specimen Description Expect. Sput  Final   Special Requests NONE Reflexed from [id number]  Final   Gram Stain   Final    ABUNDANT WBC PRESENT,BOTH PMN AND MONONUCLEAR FEW GRAM NEGATIVE RODS MODERATE GRAM POSITIVE COCCI ABUNDANT GRAM VARIABLE ROD Performed at [facility], [address], [place], [place] [address]    Culture PENDING  Incomplete   Report Status PENDING  Incomplete         Radiology Studies: DG Chest 2 View  Result Date: 11/01/2019 CLINICAL DATA:  Shortness of breath and chest pain EXAM: CHEST - 2 VIEW COMPARISON:  None. FINDINGS: Cardiac shadows within normal limits. The left lung is well aerated and clear. Right lung demonstrates a large cavitary lesion in the right lower lobe with air-fluid level identified. Some adjacent atelectatic changes are noted. No pneumothorax is seen. No bony abnormality is noted. IMPRESSION: Changes most consistent with a cavitary pneumonia/abscess. CT with contrast is recommended for further delineation. Electronically Signed   By: Alcide Clever M.D.   On: 11/01/2019 12:51   CT Angio Chest PE W and/or Wo Contrast  Result Date: 11/02/2019 CLINICAL DATA:  Chest pain EXAM: CT ANGIOGRAPHY CHEST WITH CONTRAST TECHNIQUE: Multidetector CT imaging of the chest was performed using the standard protocol during bolus administration of intravenous contrast. Multiplanar CT image reconstructions and MIPs were obtained to evaluate the vascular anatomy. CONTRAST:  50mL OMNIPAQUE IOHEXOL 350 MG/ML SOLN COMPARISON:  Chest x-ray 11/01/2019 FINDINGS:  Cardiovascular: Satisfactory opacification of the pulmonary arteries to the segmental level. No definite acute pulmonary embolus is visualized. Hypoenhancement of distal right pulmonary vessels due to lung consolidation and mass effect from large cavitary process. Aorta is nonaneurysmal. Cardiac size is normal. Mediastinum/Nodes: Midline trachea. No thyroid mass. Probable right hilar adenopathy measuring up to 12 mm. Subcarinal node measuring up to 15 mm. Lungs/Pleura: Underlying emphysematous disease throughout the lungs. Large cavitary process involving the majority of the right lower lobe and much of the right middle lobe with relative sparing of the right upper  lobe. Consolidated lung parenchyma in the right middle and lower lobes. Multiple fluid levels are noted. Internal thickened septa within the right lower lobe cavitary process. There is probable small amount of right pleural effusion Upper Abdomen: No acute abnormality. Musculoskeletal: No chest wall abnormality. No acute or significant osseous findings. Review of the MIP images confirms the above findings. IMPRESSION: 1. No definite CT evidence for acute pulmonary embolus. 2. Large cavitary process involving majority of the right lower lobe and most of the right middle lobe. There are multiple internal fluid levels within the cavitary process which appears to localize to the lung parenchyma as opposed to the pleural space. Suspect necrotizing pneumonia with probable pulmonary abscess. There is dense consolidation within the right middle and lower lobes. 3. The remaining aerated lung parenchyma is abnormal with presence of emphysematous disease. Consider alpha-1 antitrypsin deficiency given patient age and presence of extensive emphysema. 4. Right hilar and mediastinal adenopathy is likely reactive Electronically Signed   By: Jasmine PangKim  Fujinaga M.D.   On: 11/02/2019 03:00        Scheduled Meds: . chlorpheniramine-HYDROcodone  5 mL Oral Q12H  .  enoxaparin (LOVENOX) injection  30 mg Subcutaneous QHS  . feeding supplement (ENSURE ENLIVE)  237 mL Oral BID BM  . sodium chloride flush  3 mL Intravenous Once   Continuous Infusions: . sodium chloride 100 mL/hr at 11/03/19 0422  . ampicillin-sulbactam (UNASYN) IV       LOS: 1 day    Time spent: 35 minutes    Alberteen Samhristopher P Bruk Tumolo, MD [facility] 11/03/2019, 8:36 AM     Please page though AMION or Epic secure chat:  For [employer] Corporation[employer] password, Higher education careers advisercontact charge nurse

## 2019-11-03 NOTE — Progress Notes (Signed)
NAME:  Ellen Skinner, MRN:  [id number], DOB:  01-05-1996, LOS: 1 ADMISSION DATE:  11/01/2019, CONSULTATION DATE:  11/02/19 REFERRING MD:  Louanne Belton, CHIEF COMPLAINT:  Pleurisy   Brief History   24 year old woman with history of scleroderma presenting with abnormal CT chest.  History of present illness   24 year old woman with history of scleroderma not currently on therapy presenting for worsening right sided chest wall pain.  Started in mid-July, went to local [facility] where she was diagnosed with a symptomatic bullae.  Case was discussed with cardiothoracic surgeon there who was hesitant to offer lobectomy due to issues down the line with potentially needing lung transplant (lungs also showing significant paraseptal emphysema).  She was sent home with script for advil and prednisone taper.  She is visiting here for her brother's commencement ceremony, had worsening chest wall pain and fevers.  CT Chest here showing what appears to be necrotic lung.  She does have a cough but not really able to bring anything up.  No recent sick contacts.  She was on hydroxychloroquine up to the admission in mid-July.  No aspiration symptoms, no GI symptoms.  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  Denies Sicca, joint issues, DOE (until recently).   Significant Hospital Events   None  Consults:  None  Procedures:  None  Significant Diagnostic Tests:  CTA Chest IMPRESSION: 1. No definite CT evidence for acute pulmonary embolus. 2. Large cavitary process involving majority of the right lower lobe and most of the right middle lobe. There are multiple internal fluid levels within the cavitary process which appears to localize to the lung parenchyma as opposed to the pleural space. Suspect necrotizing pneumonia with probable pulmonary abscess. There is dense consolidation within the right middle and lower lobes. 3. The remaining  aerated lung parenchyma is abnormal with presence of emphysematous disease. Consider alpha-1 antitrypsin deficiency given patient age and presence of extensive emphysema. 4. Right hilar and mediastinal adenopathy is likely reactive  A1At 162, MM (ie does not have A1AT def)  Micro Data:  COVID neg  Antimicrobials:  Vanc 8/3>> Zosyn 8/3>>   Interim history/subjective:   Feeling better on antibiotics.  Still some cough   Objective   Blood pressure 109/83, pulse (!) 102, temperature 98.1 F (36.7 C), resp. rate 16, height _0  (1.549 m), weight 51.3 kg, last menstrual period 10/05/2019, SpO2 (!) 78 %.        Intake/Output Summary (Last 24 hours) at 11/03/2019 0910 Last data filed at 11/03/2019 0422 Gross per 24 hour  Intake 1003.53 ml  Output 200 ml  Net 803.53 ml   Filed Weights   11/01/19 1231 11/01/19 2243  Weight: 53.1 kg 51.3 kg    Examination: General: Thin, no distress HENT: Oropharynx dry, some evidence for skin tightness, not severe Lungs: Scattered rhonchi, especially diminished right base Cardiovascular: Regular, no murmur Abdomen:, Nondistended, positive bowel sounds Extremities: No edema, unremarkable Neuro: Awake, alert, appropriate, follows commands, moves all extremities  Resolved Hospital Problem list   None  Assessment & Plan:  Infected bullae +/- necrotic lung +/- lung abscess- most of RLL and RML are gone.  Was immunosuppressed until recently.  No aspiration symptoms.   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  -Dr. Tamala Julian reviewed images with Dr. Kipp Brood on 8/3 ([facility]CTS).  She would require pneumonectomy if unable to effectively treat as above -Based on progression of underlying lung disease  she likely needs a step up in her DMARD therapy once this infection has been adequately treated    Labs   CBC: Recent Labs  Lab 11/01/19 1239 11/02/19 0405 11/03/19 0111  WBC 31.2* 29.7* 34.2*  HGB 11.1* 10.9* 10.7*  HCT 34.7* 34.2* 33.7*  MCV 91.1 93.2 91.1  PLT 343 357 456    Basic Metabolic Panel: Recent Labs  Lab 11/01/19 1239 11/02/19 0405 11/03/19 0111  NA 138 134* 134*  K 3.8 4.1 4.8  CL 104 103 102  CO2 21* 22 17*  GLUCOSE 88 121* 95  BUN _0 CREATININE 0.68 0.76 2.60*  CALCIUM 8.7* 8.3* 8.5*   GFR: Estimated Creatinine Clearance: 25.6 mL/min (A) (by C-G formula based on SCr of 2.6 mg/dL (H)). Recent Labs  Lab 11/01/19 1239 11/02/19 0405 11/02/19 0521 11/03/19 0111  WBC 31.2* 29.7*  --  34.2*  LATICACIDVEN  --   --  1.7  --     Liver Function Tests: Recent Labs  Lab 11/03/19 0111  AST 23  ALT 26  ALKPHOS 165*  BILITOT 1.6*  PROT 7.7  ALBUMIN 1.9*   No results for input(s): LIPASE, AMYLASE in the last 168 hours. No results for input(s): AMMONIA in the last 168 hours.  ABG No results found for: PHART, PCO2ART, PO2ART, HCO3, TCO2, ACIDBASEDEF, O2SAT   Coagulation Profile: Recent Labs  Lab 11/03/19 0111  INR 1.3*    Baltazar Apo, MD, PhD 11/03/2019, 9:10 AM [facility] [phone] or if no answer [phone]

## 2019-11-03 NOTE — Progress Notes (Signed)
Pharmacy Antibiotic Note  Jamiya Nims is a 24 y.o. female admitted on 11/01/2019 with SOB and chest pain.  Found to have necrotizing PNA with probable abscess.  Pharmacy has been consulted to narrow antibiotics to Unasyn monotherapy.  Patient has an AKI, SCr 0.7 > 2.6, CrCL 26 ml/min, now afebrile, WBC up to 34.2.  Plan: Unasyn 1.5gm IV Q12H Monitor renal fxn, clinical progress  Height: 5\' 1"  (154.9 cm) Weight: 51.3 kg (113 lb) IBW/kg (Calculated) : 47.8  Temp (24hrs), Avg:99.3 F (37.4 C), Min:97.7 F (36.5 C), Max:101.9 F (38.8 C)  Recent Labs  Lab 11/01/19 1239 11/02/19 0405 11/02/19 0521 11/03/19 0111  WBC 31.2* 29.7*  --  34.2*  CREATININE 0.68 0.76  --  2.60*  LATICACIDVEN  --   --  1.7  --     Estimated Creatinine Clearance: 25.6 mL/min (A) (by C-G formula based on SCr of 2.6 mg/dL (H)).    No Known Allergies  Vanc 8/3>>8/4 Zosyn 8/3>>8/4 CTX x 1 8/3 Unasyn 8/4 >>  8/3 covid / MRSA PCR - negative 8/3 TA - GNR / GPC / GVR on Gram stain 8/3 BCx - NGTD  Corey Laski D. 10/4, PharmD, BCPS, BCCCP 11/03/2019, 8:05 AM

## 2019-11-04 ENCOUNTER — Inpatient Hospital Stay (HOSPITAL_COMMUNITY): Payer: BC Managed Care – PPO

## 2019-11-04 ENCOUNTER — Encounter (HOSPITAL_COMMUNITY): Payer: Self-pay | Admitting: Emergency Medicine

## 2019-11-04 DIAGNOSIS — J850 Gangrene and necrosis of lung: Secondary | ICD-10-CM | POA: Diagnosis not present

## 2019-11-04 DIAGNOSIS — J851 Abscess of lung with pneumonia: Secondary | ICD-10-CM | POA: Diagnosis not present

## 2019-11-04 DIAGNOSIS — M349 Systemic sclerosis, unspecified: Secondary | ICD-10-CM | POA: Diagnosis not present

## 2019-11-04 LAB — CULTURE, RESPIRATORY W GRAM STAIN: Culture: NORMAL

## 2019-11-04 LAB — RENAL FUNCTION PANEL
Albumin: 1.8 g/dL — ABNORMAL LOW (ref 3.5–5.0)
Anion gap: 16 — ABNORMAL HIGH (ref 5–15)
BUN: 33 mg/dL — ABNORMAL HIGH (ref 6–20)
CO2: 17 mmol/L — ABNORMAL LOW (ref 22–32)
Calcium: 8.1 mg/dL — ABNORMAL LOW (ref 8.9–10.3)
Chloride: 103 mmol/L (ref 98–111)
Creatinine, Ser: 5.36 mg/dL — ABNORMAL HIGH (ref 0.44–1.00)
GFR calc Af Amer: 12 mL/min — ABNORMAL LOW (ref >60)
GFR calc non Af Amer: 10 mL/min — ABNORMAL LOW (ref >60)
Glucose, Bld: 154 mg/dL — ABNORMAL HIGH (ref 70–99)
Phosphorus: 5.8 mg/dL — ABNORMAL HIGH (ref 2.5–4.6)
Potassium: 5.1 mmol/L (ref 3.5–5.1)
Sodium: 136 mmol/L (ref 135–145)

## 2019-11-04 LAB — CBC
HCT: 39 % (ref 36.0–46.0)
Hemoglobin: 12.6 g/dL (ref 12.0–15.0)
MCH: 29.2 pg (ref 26.0–34.0)
MCHC: 32.3 g/dL (ref 30.0–36.0)
MCV: 90.5 fL (ref 80.0–100.0)
Platelets: 391 10*3/uL (ref 150–400)
RBC: 4.31 MIL/uL (ref 3.87–5.11)
RDW: 13.9 % (ref 11.5–15.5)
WBC: 38.4 10*3/uL — ABNORMAL HIGH (ref 4.0–10.5)
nRBC: 0 % (ref 0.0–0.2)

## 2019-11-04 MED ORDER — SODIUM BICARBONATE 650 MG PO TABS
650.0000 mg | ORAL_TABLET | Freq: Two times a day (BID) | ORAL | Status: DC
  Administered 2019-11-04 – 2019-11-09 (×11): 650 mg via ORAL
  Filled 2019-11-04 (×11): qty 1

## 2019-11-04 MED ORDER — HEPARIN SODIUM (PORCINE) 5000 UNIT/ML IJ SOLN
5000.0000 [IU] | Freq: Three times a day (TID) | INTRAMUSCULAR | Status: DC
  Administered 2019-11-04 (×2): 5000 [IU] via SUBCUTANEOUS
  Filled 2019-11-04 (×4): qty 1

## 2019-11-04 MED ORDER — SODIUM CHLORIDE 0.9 % IV SOLN
1.5000 g | INTRAVENOUS | Status: DC
  Administered 2019-11-05 – 2019-11-06 (×2): 1.5 g via INTRAVENOUS
  Filled 2019-11-04: qty 4
  Filled 2019-11-04: qty 1.5
  Filled 2019-11-04: qty 4

## 2019-11-04 NOTE — Progress Notes (Signed)
PROGRESS NOTE    Ellen Skinner  SWH:[id number] DOB: [birth year] DOA: 11/01/2019 PCP: System, Pcp Not In      Brief Narrative:  Ellen Skinner is a 24 y.o. F with recently diagnosed scleroderma and emphysema presented with cough and shortness of breath.  In the ER, CTA showed a large cavitary lesion with dense surrounding pneumonia, partially fluid filled, WBC 31K.          Assessment & Plan:  Suspected infected bullae with surrounding pneumonia Patient admitted and started on empiric antibiotics.  Pulm consulted.  -Continue Unasyn, day 4 of antibiotics -Consult pulmonology, appreciate expert cares    Acute renal failure Cr 0.8 at baseline.  Yesterday up to 2.6, today up to >5 mg/dL.    This is in the context of recent contrast, Toradol, and Vanco Zosyn.   No UOP documented yesterday.  HCO3 down slightly.  FeNA low.  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   -Continue IV fluids -Consult Nephrology  -Stop Vanco and Zosyn -Stop NSAIDs -Avoid all nephrotoxins -Monitor renal output   Scleroderma  Emphysema   Anemia, mild Hgb stable       Disposition: Status is: Inpatient  Remains inpatient appropriate because:Persistent severe electrolyte disturbances   Dispo:  Patient From: Home  Planned Disposition: Home with Health Care Svc  Expected discharge date: 11/05/19  Medically stable for discharge: No                   MDM: The below labs and imaging reports were reviewed and summarized above.  Medication management as above.    DVT prophylaxis: heparin injection 5,000 Units Start: 11/04/19 0915  Code Status: FULL Family Communication: Mother at bedside    Consultants:   Pulmonology  Nephrology   Procedures:   8/4 bronchoscopy Findings:      The laryngeal mask airway is in good position. The vocal cords appear       normal. The subglottic space is normal. The trachea is of normal       caliber. The carina is sharp. The  tracheobronchial tree was examined to       at least the first subsegmental level. Bronchial mucosa and anatomy are       normal; there are no endobronchial lesions, and no secretions.      Bronchoalveolar lavage was performed in the right middle lobe of the       lung and sent for cell count, bacterial culture, and fungal & AFB       analysis. 60 mL of fluid were instilled. 28 mL were returned. The return       was clear. There were no mucoid plugs in the return fluid. Impression:            - Right upper lobe pneumonia                        - Right middle lobe pneumonia                        - Right lower lobe pneumonia                        - The airway examination was normal.                        - Bronchoalveolar lavage was performed in the right  lower lobe to be sent for culture data.    Antimicrobials:    Vancomycin/Zosyn 8/2 >> 8/3  Unasyn 8/4 >>  Culture data:   8/2  Blood culture x1 ngtd  8/3 sputum culture -- pending  8/4 BAL culture -- pending  8/4 BAL fungal culture -- pending          Subjective: He is getting better, she is less short of breath, she has no orthopnea or swelling.  She is making urine output.  She has no fever.  Her sputum production is getting better.  No hemoptysis, no confusion, chest pain is improved.  Objective: Vitals:   11/03/19 1115 11/03/19 1615 11/03/19 2057 11/04/19 0738  BP: 128/76 119/71 128/83 114/82  Pulse:  98 73 74  Resp: (!) _0 Temp:  98.8 F (37.1 C) 97.7 F (36.5 C) (!) 97.5 F (36.4 C)  TempSrc:   Oral   SpO2: 100% 100% 100% 97%  Weight:      Height:        Intake/Output Summary (Last 24 hours) at 11/04/2019 1143 Last data filed at 11/04/2019 0800 Gross per 24 hour  Intake 1742.52 ml  Output --  Net 1742.52 ml   Filed Weights   11/01/19 1231 11/01/19 2243  Weight: 53.1 kg 51.3 kg    Examination: General appearance: Thin adult female, sitting in bed, no acute  distress, interactive     HEENT: Masslike facies, anicteric, conjunctival pink, lids and lashes normal.  Lips normal, oropharynx moist, hearing normal. Skin: Induration of the skin of the hands and forearms.  No other suspicious rashes or lesions. Cardiac: RRR, no murmurs, trace lower extremity edema Respiratory: Respiratory rate and rhythm are normal, lungs clear without rales or wheezes but severely diminished on the right Abdomen: Abdomen soft without ascites or distention MSK: Normal muscle bulk and tone, sclerodactyly noted. Neuro: Attention normal, alert and awake, extraocular movements intact, moves all extremities with normal strength and coordination, speech fluent Psych: Sensorium intact and responding to questions, attention normal, affect normal, judgment is supranormal       Data Reviewed: I have personally reviewed following labs and imaging studies:  CBC: Recent Labs  Lab 11/01/19 1239 11/02/19 0405 11/03/19 0111 11/04/19 0205  WBC 31.2* 29.7* 34.2* 38.4*  HGB 11.1* 10.9* 10.7* 12.6  HCT 34.7* 34.2* 33.7* 39.0  MCV 91.1 93.2 91.1 90.5  PLT 343 357 342 616   Basic Metabolic Panel: Recent Labs  Lab 11/01/19 1239 11/02/19 0405 11/03/19 0111 11/04/19 0205  NA 138 134* 134* 136  K 3.8 4.1 4.8 5.1  CL 104 103 102 103  CO2 21* 22 17* 17*  GLUCOSE 88 121* 95 154*  BUN _1 33*  CREATININE 0.68 0.76 2.60* 5.36*  CALCIUM 8.7* 8.3* 8.5* 8.1*  PHOS  --   --   --  5.8*   GFR: Estimated Creatinine Clearance: 12.4 mL/min (A) (by C-G formula based on SCr of 5.36 mg/dL (H)). Liver Function Tests: Recent Labs  Lab 11/03/19 0111 11/04/19 0205  AST 23  --   ALT 26  --   ALKPHOS 165*  --   BILITOT 1.6*  --   PROT 7.7  --   ALBUMIN 1.9* 1.8*   No results for input(s): LIPASE, AMYLASE in the last 168 hours. No results for input(s): AMMONIA in the last 168 hours. Coagulation Profile: Recent Labs  Lab 11/03/19 0111  INR 1.3*   Cardiac Enzymes: No results  for  input(s): CKTOTAL, CKMB, CKMBINDEX, TROPONINI in the last 168 hours. BNP (last 3 results) No results for input(s): PROBNP in the last 8760 hours. HbA1C: No results for input(s): HGBA1C in the last 72 hours. CBG: No results for input(s): GLUCAP in the last 168 hours. Lipid Profile: No results for input(s): CHOL, HDL, LDLCALC, TRIG, CHOLHDL, LDLDIRECT in the last 72 hours. Thyroid Function Tests: No results for input(s): TSH, T4TOTAL, FREET4, T3FREE, THYROIDAB in the last 72 hours. Anemia Panel: No results for input(s): VITAMINB12, FOLATE, FERRITIN, TIBC, IRON, RETICCTPCT in the last 72 hours. Urine analysis:    Component Value Date/Time   COLORURINE AMBER (A) 11/03/2019 1100   APPEARANCEUR CLOUDY (A) 11/03/2019 1100   LABSPEC 1.018 11/03/2019 1100   PHURINE 5.0 11/03/2019 1100   GLUCOSEU NEGATIVE 11/03/2019 1100   HGBUR NEGATIVE 11/03/2019 1100   BILIRUBINUR NEGATIVE 11/03/2019 1100   KETONESUR NEGATIVE 11/03/2019 1100   PROTEINUR 100 (A) 11/03/2019 1100   NITRITE NEGATIVE 11/03/2019 1100   LEUKOCYTESUR NEGATIVE 11/03/2019 1100   Sepsis Labs: _0 (procalcitonin:4,lacticacidven:4)  ) Recent Results (from the past 240 hour(s))  SARS Coronavirus 2 by RT PCR (hospital order, performed in [place] hospital lab) Nasopharyngeal Nasopharyngeal Swab     Status: None   Collection Time: 11/02/19  2:48 AM   Specimen: Nasopharyngeal Swab  Result Value Ref Range Status   SARS Coronavirus 2 NEGATIVE NEGATIVE Final    Comment: (NOTE) SARS-CoV-2 target nucleic acids are NOT DETECTED.  The SARS-CoV-2 RNA is generally detectable in upper and lower respiratory specimens during the acute phase of infection. The lowest concentration of SARS-CoV-2 viral copies this assay can detect is 250 copies / mL. A negative result does not preclude SARS-CoV-2 infection and should not be used as the sole basis for treatment or other patient management decisions.  A negative result may occur  with improper specimen collection / handling, submission of specimen other than nasopharyngeal swab, presence of viral mutation(s) within the areas targeted by this assay, and inadequate number of viral copies (<250 copies / mL). A negative result must be combined with clinical observations, patient history, and epidemiological information.  Fact Sheet for Patients:   StrictlyIdeas.no  Fact Sheet for Healthcare Providers: BankingDealers.co.za  This test is not yet approved or  cleared by the [place] FDA and has been authorized for detection and/or diagnosis of SARS-CoV-2 by FDA under an Emergency Use Authorization (EUA).  This EUA will remain in effect (meaning this test can be used) for the duration of the COVID-19 declaration under Section 564(b)(1) of the Act, 21 U.S.C. section 360bbb-3(b)(1), unless the authorization is terminated or revoked sooner.  Performed at [facility], [place] [address] [place], [place] [address]   MRSA PCR Screening     Status: None   Collection Time: 11/02/19  4:05 AM   Specimen: Nasopharyngeal  Result Value Ref Range Status   MRSA by PCR NEGATIVE NEGATIVE Final    Comment:        The GeneXpert MRSA Assay (FDA approved for NASAL specimens only), is one component of a comprehensive MRSA colonization surveillance program. It is not intended to diagnose MRSA infection nor to guide or monitor treatment for MRSA infections. Performed at [facility], [place] [address] [place], [place] [address]   Culture, blood (routine x 2) Call MD if unable to obtain prior to antibiotics being given     Status: None (Preliminary result)   Collection Time: 11/02/19  4:40 AM   Specimen:  BLOOD  Result Value Ref Range Status   Specimen Description BLOOD RIGHT ARM  Final   Special Requests   Final    BOTTLES DRAWN AEROBIC AND ANAEROBIC Blood Culture results may not be optimal due to an inadequate volume  of blood received in culture bottles   Culture   Final    NO GROWTH 1 DAY Performed at [place] [address], [place], [place] [address]    Report Status PENDING  Incomplete  Culture, sputum-assessment     Status: None   Collection Time: 11/02/19  8:31 AM   Specimen: Expectorated Sputum  Result Value Ref Range Status   Specimen Description Expect. Sput  Final   Special Requests NONE  Final   Sputum evaluation   Final    THIS SPECIMEN IS ACCEPTABLE FOR SPUTUM CULTURE Performed at [facility], [address] [place], [place] [address]    Report Status 11/02/2019 FINAL  Final  Culture, respiratory     Status: None (Preliminary result)   Collection Time: 11/02/19  8:31 AM  Result Value Ref Range Status   Specimen Description Expect. Sput  Final   Special Requests NONE Reflexed from [id number]  Final   Gram Stain   Final    ABUNDANT WBC PRESENT,BOTH PMN AND MONONUCLEAR FEW GRAM NEGATIVE RODS MODERATE GRAM POSITIVE COCCI ABUNDANT GRAM VARIABLE ROD    Culture   Final    FEW Normal respiratory flora-no Staph aureus or Pseudomonas seen Performed at [facility], [address] [place], [place] [address]    Report Status PENDING  Incomplete  Culture, respiratory     Status: None (Preliminary result)   Collection Time: 11/03/19 10:51 AM   Specimen: PATH Cytology Bronchial lavage; Respiratory  Result Value Ref Range Status   Specimen Description Bronch Lavag  Final   Special Requests NONE  Final   Gram Stain   Final    RARE WBC PRESENT,BOTH PMN AND MONONUCLEAR NO ORGANISMS SEEN    Culture   Final    NO GROWTH < 24 HOURS Performed at [facility], [place] [address] [place], [place] [address]    Report Status PENDING  Incomplete         Radiology Studies: No results found.      Scheduled Meds: . chlorpheniramine-HYDROcodone  5 mL Oral Q12H  . feeding supplement (ENSURE ENLIVE)  237 mL Oral BID BM  . heparin injection (subcutaneous)  5,000  Units Subcutaneous Q8H  . sodium chloride flush  3 mL Intravenous Once   Continuous Infusions: . sodium chloride 100 mL/hr at 11/04/19 0623  . [START ON 11/05/2019] ampicillin-sulbactam (UNASYN) IV       LOS: 2 days    Time spent: 25 minutes    Edwin Dada, MD [facility] 11/04/2019, 11:43 AM     Please page though [employer] or Epic secure chat:  For [employer], Adult nurse

## 2019-11-04 NOTE — Plan of Care (Signed)

## 2019-11-04 NOTE — Progress Notes (Signed)
Pharmacy Antibiotic Note  Ellen Skinner is a 24 y.o. female admitted on 11/01/2019 with SOB and chest pain.  Found to have necrotizing PNA with probable abscess.  Pharmacy has been consulted to narrow antibiotics to Unasyn monotherapy.  Patient has an AKI, SCr 0.7 > 2.6 > 5.3, CrCL 26>>12 ml/min, poor UOP.  Remains afebrile, WBC up to 38. BAL - complete right lung pneumonia, cultures sent.   Plan: Reduce Unasyn to 1.5g IV every 24 hour due to severity of AKI, lack of UOP and SCr increase in last 24 hrs despite borderline CrCl. Planned discussed with Dr. Loleta Books who is in agreement.  Will continue to monitor and if improvement in UOP and SCr will increase back to Q12h.  Monitor renal fxn, clinical progress Follow-up BAL culture  Height: _0  (154.9 cm) Weight: 51.3 kg (113 lb) IBW/kg (Calculated) : 47.8  Temp (24hrs), Avg:98 F (36.7 C), Min:97.5 F (36.4 C), Max:98.8 F (37.1 C)  Recent Labs  Lab 11/01/19 1239 11/02/19 0405 11/02/19 0521 11/03/19 0111 11/04/19 0205  WBC 31.2* 29.7*  --  34.2* 38.4*  CREATININE 0.68 0.76  --  2.60* 5.36*  LATICACIDVEN  --   --  1.7  --   --     Estimated Creatinine Clearance: 12.4 mL/min (A) (by C-G formula based on SCr of 5.36 mg/dL (H)).    No Known Allergies  Vanc 8/3>>8/4 Zosyn 8/3>>8/4 CTX x 1 8/3 Unasyn 8/4 >>  8/3 covid / MRSA PCR - negative 8/3 TA - GNR / GPC / GVR on Gram stain 8/3 BCx - NGTD 8/4 BAL culture >>  Sloan Leiter, PharmD, BCPS, BCCCP Clinical Pharmacist Please refer to [facility] for [facility] numbers 11/04/2019, 9:48 AM

## 2019-11-04 NOTE — Consult Note (Signed)
[facility]  INPATIENT CONSULTATION  Reason for Consultation: AKI Requesting Provider: Dr. Maryfrances Bunnellanford  HPI: Ellen Skinner is an 24 y.o. female with recently diagnosed scleroderma and paraseptal emphysema currently admitted with pneumonia who is seen for evaluation and management of AKI.   Has h/o scleroderma dx age 24 with few symptoms on plaquenil and no f/u in about 5 years Lenis Noon([facility]).  No h/o esophageal issues, renal issues, HTN.  [facility] with pleuritic CP > imaging with paraseptal emphysema and bullae in R lung.  A1AT ruled out, no role for resection per CT surgery.  Outpt referral to rheumatology pending.  Was discharged on ibuprofen and prednisone taper.  In town for [employer].  [facility]3 on 2 day history of dyspnea and cough. WBC 31k,  covid neg. CXR with cavitary PNA/abscess.  CTA showed large cavitary lesion partially fluid filled in RLL, RML representing pulmonary abscess, necrotizing PNA of RML, and RLL.  Treated initially with ceftriaxone, pip/tazo, vanc, now on unasyn. Bronch yesterday with cultures pending.   Initial BP 108/56 - has been in 110-120s during admission. Documented I/Os with no UOP x 48hrs. She says she's going normal frequency but decreased volume of clear yellow urine.   Presenting 8/3 Cr 0.68 >  0.76 > 8/4  2.6 > 8/5 5.36.   8/4 UA 1.018, 1+ protein, no blood, UNa 21, Ucr 133 > Fena 0.3%  Currently in room feeling fine with mom at bedside.  Was having N/V all day Tues though secondary to the opioid in the cough suppressant and this is not improved with antiemetic.  PMH: Past Medical History:  Diagnosis Date   COPD (chronic obstructive pulmonary disease) (HCC)    scleraderma, emphysema   Pneumonia    Scleroderma involving lung (HCC)    PSH: History reviewed. No pertinent surgical history.   Past Medical History:  Diagnosis Date   COPD (chronic obstructive pulmonary  disease) (HCC)    scleraderma, emphysema   Pneumonia    Scleroderma involving lung (HCC)     Medications:  I have reviewed the patient's current medications.  Medications Prior to Admission  Medication Sig Dispense Refill   ibuprofen (ADVIL) 200 MG tablet Take 600 mg by mouth every 6 (six) hours as needed for mild pain.     TRI-LO-MARZIA 0.18/0.215/0.25 MG-25 MCG tab Take 1 tablet by mouth daily.      ALLERGIES:  No Known Allergies  FAM HX: History reviewed. No pertinent family history.  Social History:   reports that she has never smoked. She has never used smokeless tobacco. She reports that she does not drink alcohol and does not use drugs.  ROS: 12 system ROS neg except per HPI  Blood pressure 114/82, pulse 74, temperature (!) 97.5 F (36.4 C), resp. rate 17, height 5\' 1"  (1.549 m), weight 51.3 kg, last menstrual period 10/05/2019, SpO2 97 %. PHYSICAL EXAM: Gen: lying flat in bed smiling  Eyes: EOMI, no conjunctival injection ENT: MMM no oral ulcers Neck: supple CV:  RRR Abd:  Soft, nontender, nondistended Lungs: normal WOB at rest, dec BS R base, normal BS L GU: no foley, bladder  Not palpable Extr:  No edema, mild sclerodactyly, short DIP Neuro: nonfocal Skin: warm and dry, no rashes or lesions   Results for orders placed or performed during the hospital encounter of 11/01/19 (from the past 48 hour(s))  Comprehensive metabolic panel     Status: Abnormal   Collection  Time: 11/03/19  1:11 AM  Result Value Ref Range   Sodium 134 (L) 135 - 145 mmol/L   Potassium 4.8 3.5 - 5.1 mmol/L   Chloride 102 98 - 111 mmol/L   CO2 17 (L) 22 - 32 mmol/L   Glucose, Bld 95 70 - 99 mg/dL    Comment: Glucose reference range applies only to samples taken after fasting for at least 8 hours.   BUN 17 6 - 20 mg/dL   Creatinine, Ser 1.85 (H) 0.44 - 1.00 mg/dL    Comment: DELTA CHECK NOTED   Calcium 8.5 (L) 8.9 - 10.3 mg/dL   Total Protein 7.7 6.5 - 8.1 g/dL   Albumin 1.9 (L)  3.5 - 5.0 g/dL   AST 23 15 - 41 U/L   ALT 26 0 - 44 U/L   Alkaline Phosphatase 165 (H) 38 - 126 U/L   Total Bilirubin 1.6 (H) 0.3 - 1.2 mg/dL   GFR calc non Af Amer 25 (L) >60 mL/min   GFR calc Af Amer 29 (L) >60 mL/min   Anion gap 15 5 - 15    Comment: Performed at [facility], [address] [place], [place] [address]  CBC     Status: Abnormal   Collection Time: 11/03/19  1:11 AM  Result Value Ref Range   WBC 34.2 (H) 4.0 - 10.5 K/uL   RBC 3.70 (L) 3.87 - 5.11 MIL/uL   Hemoglobin 10.7 (L) 12.0 - 15.0 g/dL   HCT 70.2 (L) 36 - 46 %   MCV 91.1 80.0 - 100.0 fL   MCH 28.9 26.0 - 34.0 pg   MCHC 31.8 30.0 - 36.0 g/dL   RDW 63.7 85.8 - 85.0 %   Platelets 342 150 - 400 K/uL   nRBC 0.0 0.0 - 0.2 %    Comment: Performed at [facility], [address] [place], [place] [address]  Protime-INR     Status: Abnormal   Collection Time: 11/03/19  1:11 AM  Result Value Ref Range   Prothrombin Time 15.6 (H) 11.4 - 15.2 seconds   INR 1.3 (H) 0.8 - 1.2    Comment: (NOTE) INR goal varies based on device and disease states. Performed at [facility], [address], [place], [place] [address]   APTT     Status: Abnormal   Collection Time: 11/03/19  1:11 AM  Result Value Ref Range   aPTT 41 (H) 24 - 36 seconds    Comment:        IF BASELINE aPTT IS ELEVATED, SUGGEST PATIENT RISK ASSESSMENT BE USED TO DETERMINE APPROPRIATE ANTICOAGULANT THERAPY. Performed at [facility], [address] [place], [place] [address]   Type and screen [facility]     Status: None   Collection Time: 11/03/19  1:11 AM  Result Value Ref Range   ABO/RH(D) O POS    Antibody Screen NEG    Sample Expiration      11/06/2019,2359 Performed at [facility], [address] [place], [place] [address]   ABO/Rh     Status: None   Collection Time: 11/03/19  2:44 AM  Result Value Ref Range   ABO/RH(D)      O POS Performed at [facility], [address]  [place], [place] [address]   Culture, respiratory     Status: None (Preliminary result)   Collection Time: 11/03/19 10:51 AM   Specimen: PATH Cytology Bronchial  lavage; Respiratory  Result Value Ref Range   Specimen Description Bronch Lavag    Special Requests NONE    Gram Stain      RARE WBC PRESENT,BOTH PMN AND MONONUCLEAR NO ORGANISMS SEEN    Culture      NO GROWTH < 24 HOURS Performed at [facility], [address], [place], [place] [address]    Report Status PENDING   Urinalysis, Routine w reflex microscopic     Status: Abnormal   Collection Time: 11/03/19 11:00 AM  Result Value Ref Range   Color, Urine AMBER (A) YELLOW    Comment: BIOCHEMICALS MAY BE AFFECTED BY COLOR   APPearance CLOUDY (A) CLEAR   Specific Gravity, Urine 1.018 1.005 - 1.030   pH 5.0 5.0 - 8.0   Glucose, UA NEGATIVE NEGATIVE mg/dL   Hgb urine dipstick NEGATIVE NEGATIVE   Bilirubin Urine NEGATIVE NEGATIVE   Ketones, ur NEGATIVE NEGATIVE mg/dL   Protein, ur 546 (A) NEGATIVE mg/dL   Nitrite NEGATIVE NEGATIVE   Leukocytes,Ua NEGATIVE NEGATIVE   RBC / HPF 0-5 0 - 5 RBC/hpf   WBC, UA 6-10 0 - 5 WBC/hpf   Bacteria, UA FEW (A) NONE SEEN   Squamous Epithelial / LPF 11-20 0 - 5    Comment: Performed at [facility], [address], [place], [place] [address]  Sodium, urine, random     Status: None   Collection Time: 11/03/19 11:00 AM  Result Value Ref Range   Sodium, Ur 21 mmol/L    Comment: Performed at [facility], [address], [place], [place] [address]  Creatinine, urine, random     Status: None   Collection Time: 11/03/19 11:00 AM  Result Value Ref Range   Creatinine, Urine 133.58 mg/dL    Comment: Performed at [facility], [address] [place], [place] [address]  Renal function panel     Status: Abnormal   Collection Time: 11/04/19  2:05 AM  Result Value Ref Range   Sodium 136 135 - 145 mmol/L   Potassium 5.1 3.5 - 5.1 mmol/L   Chloride 103 98 - 111 mmol/L   CO2 17  (L) 22 - 32 mmol/L   Glucose, Bld 154 (H) 70 - 99 mg/dL    Comment: Glucose reference range applies only to samples taken after fasting for at least 8 hours.   BUN 33 (H) 6 - 20 mg/dL   Creatinine, Ser 3.71 (H) 0.44 - 1.00 mg/dL    Comment: DELTA CHECK NOTED   Calcium 8.1 (L) 8.9 - 10.3 mg/dL   Phosphorus 5.8 (H) 2.5 - 4.6 mg/dL   Albumin 1.8 (L) 3.5 - 5.0 g/dL   GFR calc non Af Amer 10 (L) >60 mL/min   GFR calc Af Amer 12 (L) >60 mL/min   Anion gap 16 (H) 5 - 15    Comment: Performed at [facility], [address], [place], [place] [address]  CBC     Status: Abnormal   Collection Time: 11/04/19  2:05 AM  Result Value Ref Range   WBC 38.4 (H) 4.0 - 10.5 K/uL   RBC 4.31 3.87 - 5.11 MIL/uL   Hemoglobin 12.6 12.0 - 15.0 g/dL   HCT 93.8 36 - 46 %   MCV 90.5 80.0 - 100.0 fL   MCH 29.2 26.0 - 34.0 pg   MCHC 32.3 30.0 - 36.0 g/dL   RDW 10.1 75.1 - 02.5 %   Platelets 391  150 - 400 K/uL   nRBC 0.0 0.0 - 0.2 %    Comment: Performed at [facility], [address] [place], [place] [address]    No results found.  Assessment/Plan **Necrotizing pneumonia:  Suspected emphysema secondary to CTD, now with superinfection.  Cultures pending.  On Unasyn per primary.  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  This isn't consistent with a scleroderma renal crisis or GN assoc with CTD and a renal biopsy is not indicated at this time.  She looks euvolemic now and is on MIVF with no evidence of volume overload.  Reports nonoliguric, but need accurate I/Os.  For now continue supportive care maintaining euvolemia, avoiding nephrotoxins. I discussed at this point there is a chance she may need dialytic support to allow time for recovery, but often with CIN things open up and improve fairly quickly.  AM labs ordered.   Check Renal US for completeness.  **Scleroderma:  Dx age 39, minor symptoms, no rheumatology visit in several years.   Underlying lung disease thought to be related.  Has been referred to rheumatology in her local area ([place]).   **AGMA: presumed secondary to AKI, po bicarb until recovers.   Will follow, call with questions/concerns.   Tyler Pita 11/04/2019, 1:24 PM

## 2019-11-05 DIAGNOSIS — N179 Acute kidney failure, unspecified: Secondary | ICD-10-CM | POA: Diagnosis not present

## 2019-11-05 DIAGNOSIS — M349 Systemic sclerosis, unspecified: Secondary | ICD-10-CM | POA: Diagnosis not present

## 2019-11-05 DIAGNOSIS — J850 Gangrene and necrosis of lung: Secondary | ICD-10-CM | POA: Diagnosis not present

## 2019-11-05 LAB — CBC
HCT: 37.8 % (ref 36.0–46.0)
Hemoglobin: 12 g/dL (ref 12.0–15.0)
MCH: 29.6 pg (ref 26.0–34.0)
MCHC: 31.7 g/dL (ref 30.0–36.0)
MCV: 93.3 fL (ref 80.0–100.0)
Platelets: 284 10*3/uL (ref 150–400)
RBC: 4.05 MIL/uL (ref 3.87–5.11)
RDW: 14.4 % (ref 11.5–15.5)
WBC: 27.5 10*3/uL — ABNORMAL HIGH (ref 4.0–10.5)
nRBC: 0 % (ref 0.0–0.2)

## 2019-11-05 LAB — CULTURE, RESPIRATORY W GRAM STAIN: Culture: NO GROWTH

## 2019-11-05 LAB — RENAL FUNCTION PANEL
Albumin: 1.4 g/dL — ABNORMAL LOW (ref 3.5–5.0)
Albumin: 1.4 g/dL — ABNORMAL LOW (ref 3.5–5.0)
Anion gap: 15 (ref 5–15)
Anion gap: 14 (ref 5–15)
BUN: 52 mg/dL — ABNORMAL HIGH (ref 6–20)
BUN: 54 mg/dL — ABNORMAL HIGH (ref 6–20)
CO2: 12 mmol/L — ABNORMAL LOW (ref 22–32)
CO2: 18 mmol/L — ABNORMAL LOW (ref 22–32)
Calcium: 7.7 mg/dL — ABNORMAL LOW (ref 8.9–10.3)
Calcium: 7.6 mg/dL — ABNORMAL LOW (ref 8.9–10.3)
Chloride: 109 mmol/L (ref 98–111)
Chloride: 104 mmol/L (ref 98–111)
Creatinine, Ser: 7.51 mg/dL — ABNORMAL HIGH (ref 0.44–1.00)
Creatinine, Ser: 8.19 mg/dL — ABNORMAL HIGH (ref 0.44–1.00)
GFR calc Af Amer: 8 mL/min — ABNORMAL LOW (ref >60)
GFR calc Af Amer: 7 mL/min — ABNORMAL LOW (ref >60)
GFR calc non Af Amer: 7 mL/min — ABNORMAL LOW (ref >60)
GFR calc non Af Amer: 6 mL/min — ABNORMAL LOW (ref >60)
Glucose, Bld: 82 mg/dL (ref 70–99)
Glucose, Bld: 96 mg/dL (ref 70–99)
Phosphorus: 5.4 mg/dL — ABNORMAL HIGH (ref 2.5–4.6)
Phosphorus: 6.1 mg/dL — ABNORMAL HIGH (ref 2.5–4.6)
Potassium: 6 mmol/L — ABNORMAL HIGH (ref 3.5–5.1)
Potassium: 5.1 mmol/L (ref 3.5–5.1)
Sodium: 136 mmol/L (ref 135–145)
Sodium: 136 mmol/L (ref 135–145)

## 2019-11-05 LAB — CYTOLOGY - NON PAP

## 2019-11-05 MED ORDER — SODIUM BICARBONATE-DEXTROSE 150-5 MEQ/L-% IV SOLN
150.0000 meq | INTRAVENOUS | Status: DC
  Administered 2019-11-05 – 2019-11-06 (×2): 150 meq via INTRAVENOUS
  Filled 2019-11-05 (×2): qty 1000

## 2019-11-05 MED ORDER — SODIUM ZIRCONIUM CYCLOSILICATE 10 G PO PACK
10.0000 g | PACK | Freq: Three times a day (TID) | ORAL | Status: DC
  Administered 2019-11-05 (×3): 10 g via ORAL
  Filled 2019-11-05 (×3): qty 1

## 2019-11-05 MED ORDER — MENTHOL 3 MG MT LOZG
1.0000 | LOZENGE | OROMUCOSAL | Status: DC | PRN
  Filled 2019-11-05: qty 9

## 2019-11-05 MED ORDER — SODIUM BICARBONATE 8.4 % IV SOLN
INTRAVENOUS | Status: DC
  Filled 2019-11-05 (×3): qty 150

## 2019-11-05 NOTE — Progress Notes (Signed)
Subjective:  Hemodynamically stable-  950 of UOP recorded -  BUN and crt worse and K up to 6, bicarb 12  Objective Vital signs in last 24 hours: Vitals:   11/04/19 2319 11/05/19 0000 11/05/19 0500 11/05/19 0749  BP:    132/80  Pulse: (!) 102 91  99  Resp:    17  Temp:    99.3 F (37.4 C)  TempSrc:      SpO2:    94%  Weight:   60.8 kg   Height:       Weight change:   Intake/Output Summary (Last 24 hours) at 11/05/2019 0909 Last data filed at 11/05/2019 0600 Gross per 24 hour  Intake 2691.85 ml  Output 951 ml  Net 1740.85 ml    Assessment/ Plan: Pt is a 24 y.o. yo female with scleroderma and emphysema who was admitted on 11/01/2019 with PNA-  Has suffered AKI  Assessment/Plan: 1. Renal-  AKI with baseline crt less than 1.  Time course c/w contrast induced nephropathy-  Urine with 100 of protein and no RBCs. Numbers worsened again and K up to 6.  However, is nonoliguric and not overly uremic-  Will try to mitigate with sodium bicarb and lokelma-  To support until renal recovery  2. Hyperkalemia-  Will start lokelma and change IVF to sodium bicarb-  Check K later today  3. Anemia- not an issue 4. Metabolic acidosis-  Due to AKI-  Cont oral bicarb but put on bicarb drip as well   5. HTN/volume-  BP is perfect - no meds needed  6. PNA- unasyn- per primary team   Cecille Aver    Labs: Basic Metabolic Panel: Recent Labs  Lab 11/03/19 0111 11/04/19 0205 11/05/19 0657  NA 134* 136 136  K 4.8 5.1 6.0*  CL 102 103 109  CO2 17* 17* 12*  GLUCOSE 95 154* 82  BUN 17 33* 52*  CREATININE 2.60* 5.36* 7.51*  CALCIUM 8.5* 8.1* 7.7*  PHOS  --  5.8* 5.4*   Liver Function Tests: Recent Labs  Lab 11/03/19 0111 11/04/19 0205 11/05/19 0657  AST 23  --   --   ALT 26  --   --   ALKPHOS 165*  --   --   BILITOT 1.6*  --   --   PROT 7.7  --   --   ALBUMIN 1.9* 1.8* 1.4*   No results for input(s): LIPASE, AMYLASE in the last 168 hours. No results for input(s): AMMONIA in the  last 168 hours. CBC: Recent Labs  Lab 11/01/19 1239 11/01/19 1239 11/02/19 0405 11/02/19 0405 11/03/19 0111 11/04/19 0205 11/05/19 0657  WBC 31.2*   < > 29.7*   < > 34.2* 38.4* 27.5*  HGB 11.1*   < > 10.9*   < > 10.7* 12.6 12.0  HCT 34.7*   < > 34.2*   < > 33.7* 39.0 37.8  MCV 91.1  --  93.2  --  91.1 90.5 93.3  PLT 343   < > 357   < > 342 391 284   < > = values in this interval not displayed.   Cardiac Enzymes: No results for input(s): CKTOTAL, CKMB, CKMBINDEX, TROPONINI in the last 168 hours. CBG: No results for input(s): GLUCAP in the last 168 hours.  Iron Studies: No results for input(s): IRON, TIBC, TRANSFERRIN, FERRITIN in the last 72 hours. Studies/Results: US RENAL  Result Date: 11/04/2019 CLINICAL DATA:  Acute kidney injury. EXAM: RENAL / URINARY  TRACT ULTRASOUND COMPLETE COMPARISON:  None. FINDINGS: Right Kidney: Renal measurements: 13.1 x 5.6 x 6.4 cm = volume: 248 mL. Increased echogenicity of renal parenchyma is noted suggesting medical renal disease. No mass or hydronephrosis visualized. Left Kidney: Renal measurements: 13.0 x 7.0 x 6.5 cm = volume: 305 mL. Increased echogenicity renal parenchyma is noted suggesting medical renal disease. No mass or hydronephrosis visualized. Bladder: Appears normal for degree of bladder distention. Other: None. IMPRESSION: Increased echogenicity of renal parenchyma is noted bilaterally suggesting medical renal disease. No hydronephrosis or renal obstruction is noted. Electronically Signed   By: Lupita Raider M.D.   On: 11/04/2019 16:26   Medications: Infusions: . ampicillin-sulbactam (UNASYN) IV Stopped (11/05/19 0300)  .  sodium bicarbonate  infusion 1000 mL      Scheduled Medications: . chlorpheniramine-HYDROcodone  5 mL Oral Q12H  . feeding supplement (ENSURE ENLIVE)  237 mL Oral BID BM  . heparin injection (subcutaneous)  5,000 Units Subcutaneous Q8H  . sodium bicarbonate  650 mg Oral BID  . sodium chloride flush  3 mL  Intravenous Once  . sodium zirconium cyclosilicate  10 g Oral TID    have reviewed scheduled and prn medications.  Physical Exam: General: alert, not nauseated-  IV troubles and back pain-  Mother at bedside  Heart: RRR Lungs: CBS bilat  Abdomen: soft, non tender Extremities: no edema    11/05/2019,9:09 AM  LOS: 3 days

## 2019-11-05 NOTE — Progress Notes (Signed)
NAME:  Ellen Skinner, MRN:  [id number], DOB:  [birth year], LOS: 3 ADMISSION DATE:  11/01/2019, CONSULTATION DATE:  11/02/19 REFERRING MD:  Louanne Belton, CHIEF COMPLAINT:  Pleurisy   Brief History   24 year old woman with history of scleroderma presenting with abnormal CT chest.  History of present illness   24 year old woman with history of scleroderma not currently on therapy presenting for worsening right sided chest wall pain.  Started in mid-July, went to local [facility] where she was diagnosed with a symptomatic bullae.  Case was discussed with cardiothoracic surgeon there who was hesitant to offer lobectomy due to issues down the line with potentially needing lung transplant (lungs also showing significant paraseptal emphysema).  She was sent home with script for advil and prednisone taper.  She is visiting here for her brother's commencement ceremony, had worsening chest wall pain and fevers.  CT Chest here showing what appears to be necrotic lung.  She does have a cough but not really able to bring anything up.  No recent sick contacts.  She was on hydroxychloroquine up to the admission in mid-July.  No aspiration symptoms, no GI symptoms.  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  Denies Sicca, joint issues, DOE (until recently).   Significant Hospital Events   None  Consults:  None  Procedures:  None  Significant Diagnostic Tests:  CTA Chest IMPRESSION: 1. No definite CT evidence for acute pulmonary embolus. 2. Large cavitary process involving majority of the right lower lobe and most of the right middle lobe. There are multiple internal fluid levels within the cavitary process which appears to localize to the lung parenchyma as opposed to the pleural space. Suspect necrotizing pneumonia with probable pulmonary abscess. There is dense consolidation within the right middle and lower lobes. 3. The remaining  aerated lung parenchyma is abnormal with presence of emphysematous disease. Consider alpha-1 antitrypsin deficiency given patient age and presence of extensive emphysema. 4. Right hilar and mediastinal adenopathy is likely reactive  A1At 162, MM (ie does not have A1AT def)  Micro Data:  COVID neg  Antimicrobials:  Vanc 8/3>> 8/4 Zosyn 8/3>> 8/4 Unasyn 8/4 >>   Interim history/subjective:   Still with some cough, minimal sputum Dyspnea is better and she is on room air   Objective   Blood pressure 132/80, pulse 99, temperature 99.3 F (37.4 C), resp. rate 17, height _0  (1.549 m), weight 60.8 kg, last menstrual period 10/05/2019, SpO2 94 %.        Intake/Output Summary (Last 24 hours) at 11/05/2019 1232 Last data filed at 11/05/2019 1100 Gross per 24 hour  Intake 2811.85 ml  Output 951 ml  Net 1860.85 ml   Filed Weights   11/01/19 1231 11/01/19 2243 11/05/19 0500  Weight: 53.1 kg 51.3 kg 60.8 kg    Examination: General: Thin woman, no distress HENT: Oropharynx clear, moist, some evidence for scleroderma facies Lungs: Decreased breath sounds on the right with some bronchial breath sounds bilaterally, no wheezing Cardiovascular: Regular, distant, no murmur Abdomen:, Nondistended, positive bowel sounds Extremities: No edema, unremarkable Neuro: Awake, alert, oriented, nonfocal  Resolved Hospital Problem list   None  Assessment & Plan:  Infected bullae +/- necrotic lung +/- lung abscess- most of RLL and RML are gone.  Was immunosuppressed until recently.  No aspiration symptoms.  BAL culture negative -Improving, tolerating Unasyn.  Will likely transition to Augmentin for an extended course, approximately 4  weeks.   -She will need repeat imaging in approximately 4 to 5 weeks to assess for improvement in pneumonia, bullitis.  If no improvement or if residual disease then may need consult with thoracic surgery regarding possible bullectomy, resection.  Would like to avoid  as this will be an extensive surgery and she may actually require pneumonectomy. -She needs to decide where she wants to do follow-up she is from [place], goes to school in [place] may be the best option.  She will need pulmonary follow-up as well as rheumatology.  Paraseptal emphysema, connective tissue, related ILD -Immunosuppressive regimen, DMARD therapy will have to be delayed until she clears this infection   -Continue broad-spectrum antibiotics as ordered -Plan for bronchoscopy, BAL for culture data today at 10 AM -Will require long-term antibiotics (~4 weeks), Augmentin likely good choice but will need to tailor to the BAL culture data     Labs   CBC: Recent Labs  Lab 11/01/19 1239 11/02/19 0405 11/03/19 0111 11/04/19 0205 11/05/19 0657  WBC 31.2* 29.7* 34.2* 38.4* 27.5*  HGB 11.1* 10.9* 10.7* 12.6 12.0  HCT 34.7* 34.2* 33.7* 39.0 37.8  MCV 91.1 93.2 91.1 90.5 93.3  PLT 343 357 342 391 591    Basic Metabolic Panel: Recent Labs  Lab 11/01/19 1239 11/02/19 0405 11/03/19 0111 11/04/19 0205 11/05/19 0657  NA 138 134* 134* 136 136  K 3.8 4.1 4.8 5.1 6.0*  CL 104 103 102 103 109  CO2 21* 22 17* 17* 12*  GLUCOSE 88 121* 95 154* 82  BUN _0 33* 52*  CREATININE 0.68 0.76 2.60* 5.36* 7.51*  CALCIUM 8.7* 8.3* 8.5* 8.1* 7.7*  PHOS  --   --   --  5.8* 5.4*   GFR: Estimated Creatinine Clearance: 9.8 mL/min (A) (by C-G formula based on SCr of 7.51 mg/dL (H)). Recent Labs  Lab 11/02/19 0405 11/02/19 0521 11/03/19 0111 11/04/19 0205 11/05/19 0657  WBC 29.7*  --  34.2* 38.4* 27.5*  LATICACIDVEN  --  1.7  --   --   --     Liver Function Tests: Recent Labs  Lab 11/03/19 0111 11/04/19 0205 11/05/19 0657  AST 23  --   --   ALT 26  --   --   ALKPHOS 165*  --   --   BILITOT 1.6*  --   --   PROT 7.7  --   --   ALBUMIN 1.9* 1.8* 1.4*   No results for input(s): LIPASE, AMYLASE in the last 168 hours. No results for input(s): AMMONIA in the last  168 hours.  ABG No results found for: PHART, PCO2ART, PO2ART, HCO3, TCO2, ACIDBASEDEF, O2SAT   Coagulation Profile: Recent Labs  Lab 11/03/19 0111  INR 1.3*    Baltazar Apo, MD, PhD 11/05/2019, 12:32 PM [facility] [phone] or if no answer [phone]

## 2019-11-05 NOTE — Progress Notes (Signed)
PROGRESS NOTE    Ellen Skinner  EUM:[id number] DOB: 17-Sep-1995 DOA: 11/01/2019 PCP: System, Pcp Not In      Brief Narrative:  Ellen Skinner is a 24 y.o. F with scleroderma and recently diagnosed emphysema presented with cough and shortness of breath.  In the ER, CTA showed a large cavitary lesion with dense surrounding pneumonia, partially fluid filled, WBC 31K.          Assessment & Plan:  Acute renal failure Hyperkalemia Cr 0.8 at baseline.  This developed after admission.  Creatinine up to 7 today.    K up to 6 today.  1 L urine output documented in last 24 hours, bicarb down to 12.  BUN rising, and she appears slightly puffy today. -Continue IV bicarb -Start Lokelma and monitor repeat K this afternoon -Avoid nephrotoxins -Monitor renal output -Consult nephrology, appreciate recommendations   Infected bulla with surrounding pneumonia This is an initial presenting complaint, seems to be improving.    -Continue Unasyn, day 5 -Consult pulmonology, appreciate expert cares   Scleroderma  Emphysema   Anemia, mild Hgb stable       Disposition: Status is: Inpatient  Remains inpatient appropriate because:Persistent severe electrolyte disturbances   Dispo:  Patient From: Home  Planned Disposition: Home with Health Care Svc  Expected discharge date: 11/05/19  Medically stable for discharge: No                   MDM: The below labs and imaging reports were reviewed and summarized above.  Medication management as above.    DVT prophylaxis: heparin injection 5,000 Units Start: 11/04/19 0915  Code Status: FULL Family Communication: Mother at bedside    Consultants:   Pulmonology  Nephrology   Procedures:   8/4 bronchoscopy Findings:      The laryngeal mask airway is in good position. The vocal cords appear       normal. The subglottic space is normal. The trachea is of normal       caliber. The carina is sharp. The  tracheobronchial tree was examined to       at least the first subsegmental level. Bronchial mucosa and anatomy are       normal; there are no endobronchial lesions, and no secretions.      Bronchoalveolar lavage was performed in the right middle lobe of the       lung and sent for cell count, bacterial culture, and fungal & AFB       analysis. 60 mL of fluid were instilled. 28 mL were returned. The return       was clear. There were no mucoid plugs in the return fluid. Impression:            - Right upper lobe pneumonia                        - Right middle lobe pneumonia                        - Right lower lobe pneumonia                        - The airway examination was normal.                        - Bronchoalveolar lavage was performed in the right  lower lobe to be sent for culture data.    Antimicrobials:    Vancomycin/Zosyn 8/2 >> 8/3  Unasyn 8/4 >>  Culture data:   8/2  Blood culture x1 ngtd  8/3 sputum culture --normal respiratory flora  8/4 BAL culture --no growth  8/4 BAL fungal culture -- pending          Subjective: She has worsening cough, worsening swelling, some orthopnea, and some fullness in the abdomen.  She is somewhat more somnolent.  I think she is minimizing symptoms  Objective: Vitals:   11/05/19 0000 11/05/19 0500 11/05/19 0749 11/05/19 1602  BP:   132/80 122/82  Pulse: 91  99 97  Resp:   17 18  Temp:   99.3 F (37.4 C) 98.1 F (36.7 C)  TempSrc:      SpO2:   94%   Weight:  60.8 kg    Height:        Intake/Output Summary (Last 24 hours) at 11/05/2019 1738 Last data filed at 11/05/2019 1600 Gross per 24 hour  Intake 2571.85 ml  Output 1050 ml  Net 1521.85 ml   Filed Weights   11/01/19 1231 11/01/19 2243 11/05/19 0500  Weight: 53.1 kg 51.3 kg 60.8 kg    Examination: General appearance: Thin young female, lying in bed, sitting straight up, interactive     HEENT: Mouselike facies Skin: Induration of  the skin of the hands and forearms. Cardiac: RRR, no lower extremity edema, started to get puffy in the legs and the arms Respiratory: Respiratory rate appears little bit elevated to me, lung sounds diminished, I do not appreciate rales, quite diminished on the right Abdomen: Abdomen soft, nontender to palpation, no ascites or obvious distention MSK: Sclerodactyly Neuro: Awake and alert, extraocular movements intact, moves all extremities normal strength and coordination, speech fluent Psych: Attention normal, affect normal, judgment normal          Data Reviewed: I have personally reviewed following labs and imaging studies:  CBC: Recent Labs  Lab 11/01/19 1239 11/02/19 0405 11/03/19 0111 11/04/19 0205 11/05/19 0657  WBC 31.2* 29.7* 34.2* 38.4* 27.5*  HGB 11.1* 10.9* 10.7* 12.6 12.0  HCT 34.7* 34.2* 33.7* 39.0 37.8  MCV 91.1 93.2 91.1 90.5 93.3  PLT 343 357 342 391 175   Basic Metabolic Panel: Recent Labs  Lab 11/02/19 0405 11/03/19 0111 11/04/19 0205 11/05/19 0657 11/05/19 1700  NA 134* 134* 136 136 136  K 4.1 4.8 5.1 6.0* 5.1  CL 103 102 103 109 104  CO2 22 17* 17* 12* 18*  GLUCOSE 121* 95 154* 82 96  BUN 9 17 33* 52* 54*  CREATININE 0.76 2.60* 5.36* 7.51* 8.19*  CALCIUM 8.3* 8.5* 8.1* 7.7* 7.6*  PHOS  --   --  5.8* 5.4* 6.1*   GFR: Estimated Creatinine Clearance: 9 mL/min (A) (by C-G formula based on SCr of 8.19 mg/dL (H)). Liver Function Tests: Recent Labs  Lab 11/03/19 0111 11/04/19 0205 11/05/19 0657 11/05/19 1700  AST 23  --   --   --   ALT 26  --   --   --   ALKPHOS 165*  --   --   --   BILITOT 1.6*  --   --   --   PROT 7.7  --   --   --   ALBUMIN 1.9* 1.8* 1.4* 1.4*   No results for input(s): LIPASE, AMYLASE in the last 168 hours. No results for input(s): AMMONIA in the last 168 hours.  Coagulation Profile: Recent Labs  Lab 11/03/19 0111  INR 1.3*   Cardiac Enzymes: No results for input(s): CKTOTAL, CKMB, CKMBINDEX, TROPONINI in the  last 168 hours. BNP (last 3 results) No results for input(s): PROBNP in the last 8760 hours. HbA1C: No results for input(s): HGBA1C in the last 72 hours. CBG: No results for input(s): GLUCAP in the last 168 hours. Lipid Profile: No results for input(s): CHOL, HDL, LDLCALC, TRIG, CHOLHDL, LDLDIRECT in the last 72 hours. Thyroid Function Tests: No results for input(s): TSH, T4TOTAL, FREET4, T3FREE, THYROIDAB in the last 72 hours. Anemia Panel: No results for input(s): VITAMINB12, FOLATE, FERRITIN, TIBC, IRON, RETICCTPCT in the last 72 hours. Urine analysis:    Component Value Date/Time   COLORURINE AMBER (A) 11/03/2019 1100   APPEARANCEUR CLOUDY (A) 11/03/2019 1100   LABSPEC 1.018 11/03/2019 1100   PHURINE 5.0 11/03/2019 1100   GLUCOSEU NEGATIVE 11/03/2019 1100   HGBUR NEGATIVE 11/03/2019 1100   BILIRUBINUR NEGATIVE 11/03/2019 1100   KETONESUR NEGATIVE 11/03/2019 1100   PROTEINUR 100 (A) 11/03/2019 1100   NITRITE NEGATIVE 11/03/2019 1100   LEUKOCYTESUR NEGATIVE 11/03/2019 1100   Sepsis Labs: _0 (procalcitonin:4,lacticacidven:4)  ) Recent Results (from the past 240 hour(s))  SARS Coronavirus 2 by RT PCR (hospital order, performed in [place] hospital lab) Nasopharyngeal Nasopharyngeal Swab     Status: None   Collection Time: 11/02/19  2:48 AM   Specimen: Nasopharyngeal Swab  Result Value Ref Range Status   SARS Coronavirus 2 NEGATIVE NEGATIVE Final    Comment: (NOTE) SARS-CoV-2 target nucleic acids are NOT DETECTED.  The SARS-CoV-2 RNA is generally detectable in upper and lower respiratory specimens during the acute phase of infection. The lowest concentration of SARS-CoV-2 viral copies this assay can detect is 250 copies / mL. A negative result does not preclude SARS-CoV-2 infection and should not be used as the sole basis for treatment or other patient management decisions.  A negative result may occur with improper specimen collection / handling, submission of  specimen other than nasopharyngeal swab, presence of viral mutation(s) within the areas targeted by this assay, and inadequate number of viral copies (<250 copies / mL). A negative result must be combined with clinical observations, patient history, and epidemiological information.  Fact Sheet for Patients:   StrictlyIdeas.no  Fact Sheet for Healthcare Providers: BankingDealers.co.za  This test is not yet approved or  cleared by the [place] FDA and has been authorized for detection and/or diagnosis of SARS-CoV-2 by FDA under an Emergency Use Authorization (EUA).  This EUA will remain in effect (meaning this test can be used) for the duration of the COVID-19 declaration under Section 564(b)(1) of the Act, 21 U.S.C. section 360bbb-3(b)(1), unless the authorization is terminated or revoked sooner.  Performed at [facility], [place] [address], [place], [place] [address]   MRSA PCR Screening     Status: None   Collection Time: 11/02/19  4:05 AM   Specimen: Nasopharyngeal  Result Value Ref Range Status   MRSA by PCR NEGATIVE NEGATIVE Final    Comment:        The GeneXpert MRSA Assay (FDA approved for NASAL specimens only), is one component of a comprehensive MRSA colonization surveillance program. It is not intended to diagnose MRSA infection nor to guide or monitor treatment for MRSA infections. Performed at [facility], [place] [address], [place], [place] [address]   Culture, blood (routine x 2) Call MD if unable to obtain prior to antibiotics being given  Status: None (Preliminary result)   Collection Time: 11/02/19  4:40 AM   Specimen: BLOOD  Result Value Ref Range Status   Specimen Description BLOOD RIGHT ARM  Final   Special Requests   Final    BOTTLES DRAWN AEROBIC AND ANAEROBIC Blood Culture results may not be optimal due to an inadequate volume of blood received in culture bottles   Culture   Final    NO  GROWTH 3 DAYS Performed at [facility], [place] [address] [place], [place] [address]    Report Status PENDING  Incomplete  Culture, sputum-assessment     Status: None   Collection Time: 11/02/19  8:31 AM   Specimen: Expectorated Sputum  Result Value Ref Range Status   Specimen Description Expect. Sput  Final   Special Requests NONE  Final   Sputum evaluation   Final    THIS SPECIMEN IS ACCEPTABLE FOR SPUTUM CULTURE Performed at [facility], [address] [place], [place] [address]    Report Status 11/02/2019 FINAL  Final  Culture, respiratory     Status: None   Collection Time: 11/02/19  8:31 AM  Result Value Ref Range Status   Specimen Description Expect. Sput  Final   Special Requests NONE Reflexed from [id number]  Final   Gram Stain   Final    ABUNDANT WBC PRESENT,BOTH PMN AND MONONUCLEAR FEW GRAM NEGATIVE RODS MODERATE GRAM POSITIVE COCCI ABUNDANT GRAM VARIABLE ROD    Culture   Final    FEW Normal respiratory flora-no Staph aureus or Pseudomonas seen Performed at [facility], [address], [place], [place] [address]    Report Status 11/04/2019 FINAL  Final  Culture, respiratory     Status: None   Collection Time: 11/03/19 10:51 AM   Specimen: PATH Cytology Bronchial lavage; Respiratory  Result Value Ref Range Status   Specimen Description Bronch Lavag  Final   Special Requests NONE  Final   Gram Stain   Final    RARE WBC PRESENT,BOTH PMN AND MONONUCLEAR NO ORGANISMS SEEN    Culture   Final    NO GROWTH 2 DAYS Performed at [facility], [address], [place], [place] [address]    Report Status 11/05/2019 FINAL  Final  Fungus Culture With Stain     Status: None (Preliminary result)   Collection Time: 11/03/19 10:58 AM   Specimen: PATH Cytology Bronchial lavage  Result Value Ref Range Status   Fungus Stain Final report  Final    Comment: (NOTE) Performed At: [facility] [place], [place] [id number] Rush Farmer  MD [id number]    Fungus (Mycology) Culture PENDING  Incomplete   Fungal Source BRONCHIAL ALVEOLAR LAVAGE  Final    Comment: Performed at [facility], [place] [address] [place], [place] [address]  Fungus Culture Result     Status: None   Collection Time: 11/03/19 10:58 AM  Result Value Ref Range Status   Result 1 Comment  Final    Comment: (NOTE) KOH/Calcofluor preparation:  no fungus observed. Performed At: [facility] [place], [place] [id number] Rush Farmer MD [id number]          Radiology Studies: US RENAL  Result Date: 11/04/2019 CLINICAL DATA:  Acute kidney injury. EXAM: RENAL / URINARY TRACT ULTRASOUND COMPLETE COMPARISON:  None. FINDINGS: Right Kidney: Renal measurements: 13.1 x 5.6 x 6.4 cm = volume: 248 mL. Increased echogenicity of renal parenchyma is noted suggesting  medical renal disease. No mass or hydronephrosis visualized. Left Kidney: Renal measurements: 13.0 x 7.0 x 6.5 cm = volume: 305 mL. Increased echogenicity renal parenchyma is noted suggesting medical renal disease. No mass or hydronephrosis visualized. Bladder: Appears normal for degree of bladder distention. Other: None. IMPRESSION: Increased echogenicity of renal parenchyma is noted bilaterally suggesting medical renal disease. No hydronephrosis or renal obstruction is noted. Electronically Signed   By: Marijo Conception M.D.   On: 11/04/2019 16:26        Scheduled Meds: . chlorpheniramine-HYDROcodone  5 mL Oral Q12H  . feeding supplement (ENSURE ENLIVE)  237 mL Oral BID BM  . heparin injection (subcutaneous)  5,000 Units Subcutaneous Q8H  . sodium bicarbonate  650 mg Oral BID  . sodium chloride flush  3 mL Intravenous Once  . sodium zirconium cyclosilicate  10 g Oral TID   Continuous Infusions: . ampicillin-sulbactam (UNASYN) IV Stopped (11/05/19 0300)  . sodium bicarbonate 150 mEq in dextrose 5% 1000 mL 150 mEq (11/05/19 1733)     LOS: 3 days    Time spent:  25 minutes    Edwin Dada, MD [facility] 11/05/2019, 5:38 PM     Please page though [place] or Epic secure chat:  For [employer], Adult nurse

## 2019-11-06 DIAGNOSIS — M349 Systemic sclerosis, unspecified: Secondary | ICD-10-CM | POA: Diagnosis not present

## 2019-11-06 DIAGNOSIS — J850 Gangrene and necrosis of lung: Secondary | ICD-10-CM | POA: Diagnosis not present

## 2019-11-06 LAB — CBC
HCT: 30.4 % — ABNORMAL LOW (ref 36.0–46.0)
Hemoglobin: 10.3 g/dL — ABNORMAL LOW (ref 12.0–15.0)
MCH: 29.4 pg (ref 26.0–34.0)
MCHC: 33.9 g/dL (ref 30.0–36.0)
MCV: 86.9 fL (ref 80.0–100.0)
Platelets: 330 10*3/uL (ref 150–400)
RBC: 3.5 MIL/uL — ABNORMAL LOW (ref 3.87–5.11)
RDW: 13.7 % (ref 11.5–15.5)
WBC: 23.2 10*3/uL — ABNORMAL HIGH (ref 4.0–10.5)
nRBC: 0 % (ref 0.0–0.2)

## 2019-11-06 LAB — RENAL FUNCTION PANEL
Albumin: 1.4 g/dL — ABNORMAL LOW (ref 3.5–5.0)
Anion gap: 15 (ref 5–15)
BUN: 53 mg/dL — ABNORMAL HIGH (ref 6–20)
CO2: 21 mmol/L — ABNORMAL LOW (ref 22–32)
Calcium: 7.4 mg/dL — ABNORMAL LOW (ref 8.9–10.3)
Chloride: 100 mmol/L (ref 98–111)
Creatinine, Ser: 8.49 mg/dL — ABNORMAL HIGH (ref 0.44–1.00)
GFR calc Af Amer: 7 mL/min — ABNORMAL LOW (ref >60)
GFR calc non Af Amer: 6 mL/min — ABNORMAL LOW (ref >60)
Glucose, Bld: 118 mg/dL — ABNORMAL HIGH (ref 70–99)
Phosphorus: 5.7 mg/dL — ABNORMAL HIGH (ref 2.5–4.6)
Potassium: 4.2 mmol/L (ref 3.5–5.1)
Sodium: 136 mmol/L (ref 135–145)

## 2019-11-06 MED ORDER — AMOXICILLIN-POT CLAVULANATE 875-125 MG PO TABS
1.0000 | ORAL_TABLET | ORAL | Status: DC
  Administered 2019-11-06: 1 via ORAL
  Filled 2019-11-06: qty 1

## 2019-11-06 MED ORDER — FUROSEMIDE 10 MG/ML IJ SOLN
80.0000 mg | Freq: Two times a day (BID) | INTRAMUSCULAR | Status: DC
  Administered 2019-11-06 – 2019-11-09 (×7): 80 mg via INTRAVENOUS
  Filled 2019-11-06 (×9): qty 8

## 2019-11-06 NOTE — Progress Notes (Signed)
Subjective:  Hemodynamically stable-  1200 of UOP recorded -  BUN and crt worse  Only slightly K down and bicarb up.  She is uncomfortable with feeling bloated especially in the abdomen   Objective Vital signs in last 24 hours: Vitals:   11/05/19 0749 11/05/19 1602 11/05/19 2214 11/06/19 0759  BP: 132/80 122/82 128/83 (!) 141/79  Pulse: 99 97 (!) 107 (!) 108  Resp: 17 18 20 18   Temp: 99.3 F (37.4 C) 98.1 F (36.7 C) 99.3 F (37.4 C) 99.3 F (37.4 C)  TempSrc:   Oral Oral  SpO2: 94%  99% 92%  Weight:      Height:       Weight change:   Intake/Output Summary (Last 24 hours) at 11/06/2019 0933 Last data filed at 11/06/2019 0758 Gross per 24 hour  Intake 120 ml  Output 1500 ml  Net -1380 ml    Assessment/ Plan: Pt is a 24 y.o. yo female with scleroderma and emphysema who was admitted on 11/01/2019 with PNA-  Has suffered AKI  Assessment/Plan: 1. Renal-  AKI with baseline crt less than 1.  Time course c/w contrast induced nephropathy-  Urine with 100 of protein and no RBCs. Numbers worsened again but only slightly -  Hoping that renal function is trying to plateau. K is down out of danger range.  Is nonoliguric and not overly uremic-  Still hopeful for renal recovery 2. Hyperkalemia-  Better with conservative measures-  Will stop lokelma  3. Anemia- not an issue 4. Metabolic acidosis-  Due to AKI-  Cont oral bicarb-  Stop drip  5. HTN/volume-  BP is perfect - no meds.  With the IVF is feeling bloated- will stop IVF and dose with lasix 6. PNA- unasyn- per primary team-  WBC decreasing    01/01/2020    Labs: Basic Metabolic Panel: Recent Labs  Lab 11/05/19 0657 11/05/19 1700 11/06/19 0110  NA 136 136 136  K 6.0* 5.1 4.2  CL 109 104 100  CO2 12* 18* 21*  GLUCOSE 82 96 118*  BUN 52* 54* 53*  CREATININE 7.51* 8.19* 8.49*  CALCIUM 7.7* 7.6* 7.4*  PHOS 5.4* 6.1* 5.7*   Liver Function Tests: Recent Labs  Lab 11/03/19 0111 11/04/19 0205 11/05/19 0657  11/05/19 1700 11/06/19 0110  AST 23  --   --   --   --   ALT 26  --   --   --   --   ALKPHOS 165*  --   --   --   --   BILITOT 1.6*  --   --   --   --   PROT 7.7  --   --   --   --   ALBUMIN 1.9*   < > 1.4* 1.4* 1.4*   < > = values in this interval not displayed.   No results for input(s): LIPASE, AMYLASE in the last 168 hours. No results for input(s): AMMONIA in the last 168 hours. CBC: Recent Labs  Lab 11/02/19 0405 11/02/19 0405 11/03/19 0111 11/03/19 0111 11/04/19 0205 11/05/19 0657 11/06/19 0110  WBC 29.7*   < > 34.2*   < > 38.4* 27.5* 23.2*  HGB 10.9*   < > 10.7*   < > 12.6 12.0 10.3*  HCT 34.2*   < > 33.7*   < > 39.0 37.8 30.4*  MCV 93.2  --  91.1  --  90.5 93.3 86.9  PLT 357   < > 342   < >  391 284 330   < > = values in this interval not displayed.   Cardiac Enzymes: No results for input(s): CKTOTAL, CKMB, CKMBINDEX, TROPONINI in the last 168 hours. CBG: No results for input(s): GLUCAP in the last 168 hours.  Iron Studies: No results for input(s): IRON, TIBC, TRANSFERRIN, FERRITIN in the last 72 hours. Studies/Results: US RENAL  Result Date: 11/04/2019 CLINICAL DATA:  Acute kidney injury. EXAM: RENAL / URINARY TRACT ULTRASOUND COMPLETE COMPARISON:  None. FINDINGS: Right Kidney: Renal measurements: 13.1 x 5.6 x 6.4 cm = volume: 248 mL. Increased echogenicity of renal parenchyma is noted suggesting medical renal disease. No mass or hydronephrosis visualized. Left Kidney: Renal measurements: 13.0 x 7.0 x 6.5 cm = volume: 305 mL. Increased echogenicity renal parenchyma is noted suggesting medical renal disease. No mass or hydronephrosis visualized. Bladder: Appears normal for degree of bladder distention. Other: None. IMPRESSION: Increased echogenicity of renal parenchyma is noted bilaterally suggesting medical renal disease. No hydronephrosis or renal obstruction is noted. Electronically Signed   By: Lupita Raider M.D.   On: 11/04/2019 16:26    Medications: Infusions: . ampicillin-sulbactam (UNASYN) IV 1.5 g (11/06/19 0226)    Scheduled Medications: . chlorpheniramine-HYDROcodone  5 mL Oral Q12H  . feeding supplement (ENSURE ENLIVE)  237 mL Oral BID BM  . furosemide  80 mg Intravenous Q12H  . sodium bicarbonate  650 mg Oral BID  . sodium chloride flush  3 mL Intravenous Once  . sodium zirconium cyclosilicate  10 g Oral TID    have reviewed scheduled and prn medications.  Physical Exam: General: alert, moaning due to feeling bloated- not nauseated-   Mother at bedside  Heart: RRR Lungs: CBS bilat  Abdomen: distended-  Pain out of proportion to exam  Extremities: some edema    11/06/2019,9:33 AM  LOS: 4 days

## 2019-11-06 NOTE — Progress Notes (Signed)
PROGRESS NOTE    Ellen Skinner  ZWC:[id number] DOB: 1996-02-01 DOA: 11/01/2019 PCP: System, Pcp Not In      Brief Narrative:  Ellen Skinner is a 24 y.o. F with scleroderma and recently diagnosed emphysema presented with cough and shortness of breath.  In the ER, CTA showed a large cavitary lesion with dense surrounding pneumonia, partially fluid filled, WBC 31K.          Assessment & Plan:  Acute renal failure Hyperkalemia Acute metabolic acidosis Cr 0.8 at baseline.  This developed after admission, in the context of vancomycin, Zosyn, Toradol, and iodine contrast plus some low blood pressure.    Potassium better after Lokelma, creatinine still up but less than before. 1.2 L urine output, bicarb improved with the bicarb drip.  Acidosis resolved.  Still looks puffy, has abdominal bloating, and some cough -Discussed with nephrology, will stop IV fluids, start Lasix -Strict I/Os -Daily RFP -Continue bicarb as oral      Cavitating pneumonia This is the initial presenting complaint, clinically is improving. -Transition Unasyn to Augmentin, day 6     Scleroderma  Emphysema   Anemia, mild Hemoglobin down today, slightly       Disposition: Status is: Inpatient  Remains inpatient appropriate because:Persistent severe electrolyte disturbances   Dispo:  Patient From: Home  Planned Disposition: Home with Health Care Svc  Expected discharge date: 11/05/19  Medically stable for discharge: No                   MDM: The below labs and imaging reports were reviewed and summarized above.  Medication management as above.    DVT prophylaxis: Place and maintain sequential compression device Start: 11/05/19 1835  Code Status: FULL Family Communication: Mother at bedside    Consultants:   Pulmonology  Nephrology   Procedures:   8/4 bronchoscopy Findings:      The laryngeal mask airway is in good position. The vocal cords appear       normal.  The subglottic space is normal. The trachea is of normal       caliber. The carina is sharp. The tracheobronchial tree was examined to       at least the first subsegmental level. Bronchial mucosa and anatomy are       normal; there are no endobronchial lesions, and no secretions.      Bronchoalveolar lavage was performed in the right middle lobe of the       lung and sent for cell count, bacterial culture, and fungal & AFB       analysis. 60 mL of fluid were instilled. 28 mL were returned. The return       was clear. There were no mucoid plugs in the return fluid. Impression:            - Right upper lobe pneumonia                        - Right middle lobe pneumonia                        - Right lower lobe pneumonia                        - The airway examination was normal.                        - Bronchoalveolar  lavage was performed in the right                         lower lobe to be sent for culture data.    Antimicrobials:    Vancomycin/Zosyn 8/2 >> 8/3  Unasyn 8/4 >> 8/7  Augmentin 8/7>>  Culture data:   8/2  Blood culture x1 ngtd  8/3 sputum culture --normal respiratory flora  8/4 BAL culture --no growth  8/4 BAL fungal culture -- pending          Subjective: Still some mild cough, orthopnea, she is puffy all over, she has abdominal fullness.  No confusion.  Objective: Vitals:   11/05/19 0749 11/05/19 1602 11/05/19 2214 11/06/19 0759  BP: 132/80 122/82 128/83 (!) 141/79  Pulse: 99 97 (!) 107 (!) 108  Resp: _0 Temp: 99.3 F (37.4 C) 98.1 F (36.7 C) 99.3 F (37.4 C) 99.3 F (37.4 C)  TempSrc:   Oral Oral  SpO2: 94%  99% 92%  Weight:      Height:        Intake/Output Summary (Last 24 hours) at 11/06/2019 1503 Last data filed at 11/06/2019 1200 Gross per 24 hour  Intake 360 ml  Output 2100 ml  Net -1740 ml   Filed Weights   11/01/19 1231 11/01/19 2243 11/05/19 0500  Weight: 53.1 kg 51.3 kg 60.8 kg    Examination: General  appearance: Thin adult female, lying in bed, appears uncomfortable     HEENT: mouse-like facies, no nasal  discharge, or epistaxis, oropharynx moist, no oral lesions Skin: Induration of the forearms Cardiac: RRR, no murmurs, plus edema in the extremities, nonpitting Respiratory: Respiratory effort appears a little bit elevated, lung sounds diminished, no rales, lung sounds decreased on the right Abdomen: Abdomen uncomfortable, tender to palpation throughout, and recent mild ascites MSK: Sclerodactyly Neuro: Awake and alert, extraocular movements intact, moves all extremities with generalized weakness, symmetric coordination, speech fluent Psych: Attention normal, affect normal, judgment normal            Data Reviewed: I have personally reviewed following labs and imaging studies:  CBC: Recent Labs  Lab 11/02/19 0405 11/03/19 0111 11/04/19 0205 11/05/19 0657 11/06/19 0110  WBC 29.7* 34.2* 38.4* 27.5* 23.2*  HGB 10.9* 10.7* 12.6 12.0 10.3*  HCT 34.2* 33.7* 39.0 37.8 30.4*  MCV 93.2 91.1 90.5 93.3 86.9  PLT 357 342 391 284 865   Basic Metabolic Panel: Recent Labs  Lab 11/03/19 0111 11/04/19 0205 11/05/19 0657 11/05/19 1700 11/06/19 0110  NA 134* 136 136 136 136  K 4.8 5.1 6.0* 5.1 4.2  CL 102 103 109 104 100  CO2 17* 17* 12* 18* 21*  GLUCOSE 95 154* 82 96 118*  BUN 17 33* 52* 54* 53*  CREATININE 2.60* 5.36* 7.51* 8.19* 8.49*  CALCIUM 8.5* 8.1* 7.7* 7.6* 7.4*  PHOS  --  5.8* 5.4* 6.1* 5.7*   GFR: Estimated Creatinine Clearance: 8.7 mL/min (A) (by C-G formula based on SCr of 8.49 mg/dL (H)). Liver Function Tests: Recent Labs  Lab 11/03/19 0111 11/04/19 0205 11/05/19 0657 11/05/19 1700 11/06/19 0110  AST 23  --   --   --   --   ALT 26  --   --   --   --   ALKPHOS 165*  --   --   --   --   BILITOT 1.6*  --   --   --   --  PROT 7.7  --   --   --   --   ALBUMIN 1.9* 1.8* 1.4* 1.4* 1.4*   No results for input(s): LIPASE, AMYLASE in the last 168  hours. No results for input(s): AMMONIA in the last 168 hours. Coagulation Profile: Recent Labs  Lab 11/03/19 0111  INR 1.3*   Cardiac Enzymes: No results for input(s): CKTOTAL, CKMB, CKMBINDEX, TROPONINI in the last 168 hours. BNP (last 3 results) No results for input(s): PROBNP in the last 8760 hours. HbA1C: No results for input(s): HGBA1C in the last 72 hours. CBG: No results for input(s): GLUCAP in the last 168 hours. Lipid Profile: No results for input(s): CHOL, HDL, LDLCALC, TRIG, CHOLHDL, LDLDIRECT in the last 72 hours. Thyroid Function Tests: No results for input(s): TSH, T4TOTAL, FREET4, T3FREE, THYROIDAB in the last 72 hours. Anemia Panel: No results for input(s): VITAMINB12, FOLATE, FERRITIN, TIBC, IRON, RETICCTPCT in the last 72 hours. Urine analysis:    Component Value Date/Time   COLORURINE AMBER (A) 11/03/2019 1100   APPEARANCEUR CLOUDY (A) 11/03/2019 1100   LABSPEC 1.018 11/03/2019 1100   PHURINE 5.0 11/03/2019 1100   GLUCOSEU NEGATIVE 11/03/2019 1100   HGBUR NEGATIVE 11/03/2019 1100   BILIRUBINUR NEGATIVE 11/03/2019 1100   KETONESUR NEGATIVE 11/03/2019 1100   PROTEINUR 100 (A) 11/03/2019 1100   NITRITE NEGATIVE 11/03/2019 1100   LEUKOCYTESUR NEGATIVE 11/03/2019 1100   Sepsis Labs: _0 (procalcitonin:4,lacticacidven:4)  ) Recent Results (from the past 240 hour(s))  SARS Coronavirus 2 by RT PCR (hospital order, performed in [place] hospital lab) Nasopharyngeal Nasopharyngeal Swab     Status: None   Collection Time: 11/02/19  2:48 AM   Specimen: Nasopharyngeal Swab  Result Value Ref Range Status   SARS Coronavirus 2 NEGATIVE NEGATIVE Final    Comment: (NOTE) SARS-CoV-2 target nucleic acids are NOT DETECTED.  The SARS-CoV-2 RNA is generally detectable in upper and lower respiratory specimens during the acute phase of infection. The lowest concentration of SARS-CoV-2 viral copies this assay can detect is 250 copies / mL. A negative result  does not preclude SARS-CoV-2 infection and should not be used as the sole basis for treatment or other patient management decisions.  A negative result may occur with improper specimen collection / handling, submission of specimen other than nasopharyngeal swab, presence of viral mutation(s) within the areas targeted by this assay, and inadequate number of viral copies (<250 copies / mL). A negative result must be combined with clinical observations, patient history, and epidemiological information.  Fact Sheet for Patients:   StrictlyIdeas.no  Fact Sheet for Healthcare Providers: BankingDealers.co.za  This test is not yet approved or  cleared by the [place] FDA and has been authorized for detection and/or diagnosis of SARS-CoV-2 by FDA under an Emergency Use Authorization (EUA).  This EUA will remain in effect (meaning this test can be used) for the duration of the COVID-19 declaration under Section 564(b)(1) of the Act, 21 U.S.C. section 360bbb-3(b)(1), unless the authorization is terminated or revoked sooner.  Performed at [facility], [place] [address] [place], [place] [address]   MRSA PCR Screening     Status: None   Collection Time: 11/02/19  4:05 AM   Specimen: Nasopharyngeal  Result Value Ref Range Status   MRSA by PCR NEGATIVE NEGATIVE Final    Comment:        The GeneXpert MRSA Assay (FDA approved for NASAL specimens only), is one component of a comprehensive MRSA colonization surveillance program. It is not intended  to diagnose MRSA infection nor to guide or monitor treatment for MRSA infections. Performed at [facility], [place] [address] [place], [place] [address]   Culture, blood (routine x 2) Call MD if unable to obtain prior to antibiotics being given     Status: None (Preliminary result)   Collection Time: 11/02/19  4:40 AM   Specimen: BLOOD  Result Value Ref Range Status   Specimen  Description BLOOD RIGHT ARM  Final   Special Requests   Final    BOTTLES DRAWN AEROBIC AND ANAEROBIC Blood Culture results may not be optimal due to an inadequate volume of blood received in culture bottles   Culture   Final    NO GROWTH 4 DAYS Performed at [facility], [place] [address] [place], [place] [address]    Report Status PENDING  Incomplete  Culture, sputum-assessment     Status: None   Collection Time: 11/02/19  8:31 AM   Specimen: Expectorated Sputum  Result Value Ref Range Status   Specimen Description Expect. Sput  Final   Special Requests NONE  Final   Sputum evaluation   Final    THIS SPECIMEN IS ACCEPTABLE FOR SPUTUM CULTURE Performed at [facility], [address], [place], [place] [address]    Report Status 11/02/2019 FINAL  Final  Culture, respiratory     Status: None   Collection Time: 11/02/19  8:31 AM  Result Value Ref Range Status   Specimen Description Expect. Sput  Final   Special Requests NONE Reflexed from [id number]  Final   Gram Stain   Final    ABUNDANT WBC PRESENT,BOTH PMN AND MONONUCLEAR FEW GRAM NEGATIVE RODS MODERATE GRAM POSITIVE COCCI ABUNDANT GRAM VARIABLE ROD    Culture   Final    FEW Normal respiratory flora-no Staph aureus or Pseudomonas seen Performed at [facility], [address], [place], [place] [address]    Report Status 11/04/2019 FINAL  Final  Culture, respiratory     Status: None   Collection Time: 11/03/19 10:51 AM   Specimen: PATH Cytology Bronchial lavage; Respiratory  Result Value Ref Range Status   Specimen Description Bronch Lavag  Final   Special Requests NONE  Final   Gram Stain   Final    RARE WBC PRESENT,BOTH PMN AND MONONUCLEAR NO ORGANISMS SEEN    Culture   Final    NO GROWTH 2 DAYS Performed at [facility], [address], [place], [place] [address]    Report Status 11/05/2019 FINAL  Final  Fungus Culture With Stain     Status: None (Preliminary result)   Collection Time: 11/03/19  10:58 AM   Specimen: PATH Cytology Bronchial lavage  Result Value Ref Range Status   Fungus Stain Final report  Final    Comment: (NOTE) Performed At: [facility] [place], [place] [id number] Rush Farmer MD VE:[id number]    Fungus (Mycology) Culture PENDING  Incomplete   Fungal Source BRONCHIAL ALVEOLAR LAVAGE  Final    Comment: Performed at [facility], [place] [address], [place], [place] [address]  Fungus Culture Result     Status: None   Collection Time: 11/03/19 10:58 AM  Result Value Ref Range Status   Result 1 Comment  Final    Comment: (NOTE) KOH/Calcofluor preparation:  no fungus observed. Performed At: [facility] [place], [place] [id number] Rush Farmer MD PT:[id number]  Radiology Studies: US RENAL  Result Date: 11/04/2019 CLINICAL DATA:  Acute kidney injury. EXAM: RENAL / URINARY TRACT ULTRASOUND COMPLETE COMPARISON:  None. FINDINGS: Right Kidney: Renal measurements: 13.1 x 5.6 x 6.4 cm = volume: 248 mL. Increased echogenicity of renal parenchyma is noted suggesting medical renal disease. No mass or hydronephrosis visualized. Left Kidney: Renal measurements: 13.0 x 7.0 x 6.5 cm = volume: 305 mL. Increased echogenicity renal parenchyma is noted suggesting medical renal disease. No mass or hydronephrosis visualized. Bladder: Appears normal for degree of bladder distention. Other: None. IMPRESSION: Increased echogenicity of renal parenchyma is noted bilaterally suggesting medical renal disease. No hydronephrosis or renal obstruction is noted. Electronically Signed   By: Marijo Conception M.D.   On: 11/04/2019 16:26        Scheduled Meds: . amoxicillin-clavulanate  1 tablet Oral Q12H  . chlorpheniramine-HYDROcodone  5 mL Oral Q12H  . feeding supplement (ENSURE ENLIVE)  237 mL Oral BID BM  . furosemide  80 mg Intravenous Q12H  . sodium bicarbonate  650 mg Oral BID  . sodium chloride flush  3 mL Intravenous  Once   Continuous Infusions:    LOS: 4 days    Time spent: 25 minutes    Edwin Dada, MD [facility] 11/06/2019, 3:03 PM     Please page though [place] or Epic secure chat:  For [employer], Adult nurse

## 2019-11-07 ENCOUNTER — Inpatient Hospital Stay (HOSPITAL_COMMUNITY): Payer: BC Managed Care – PPO

## 2019-11-07 DIAGNOSIS — N179 Acute kidney failure, unspecified: Secondary | ICD-10-CM | POA: Diagnosis not present

## 2019-11-07 DIAGNOSIS — J850 Gangrene and necrosis of lung: Secondary | ICD-10-CM | POA: Diagnosis not present

## 2019-11-07 LAB — RENAL FUNCTION PANEL
Albumin: 1.5 g/dL — ABNORMAL LOW (ref 3.5–5.0)
Anion gap: 16 — ABNORMAL HIGH (ref 5–15)
BUN: 56 mg/dL — ABNORMAL HIGH (ref 6–20)
CO2: 24 mmol/L (ref 22–32)
Calcium: 7.8 mg/dL — ABNORMAL LOW (ref 8.9–10.3)
Chloride: 98 mmol/L (ref 98–111)
Creatinine, Ser: 9.86 mg/dL — ABNORMAL HIGH (ref 0.44–1.00)
GFR calc Af Amer: 6 mL/min — ABNORMAL LOW (ref >60)
GFR calc non Af Amer: 5 mL/min — ABNORMAL LOW (ref >60)
Glucose, Bld: 103 mg/dL — ABNORMAL HIGH (ref 70–99)
Phosphorus: 8.3 mg/dL — ABNORMAL HIGH (ref 2.5–4.6)
Potassium: 3.9 mmol/L (ref 3.5–5.1)
Sodium: 138 mmol/L (ref 135–145)

## 2019-11-07 LAB — CULTURE, BLOOD (ROUTINE X 2): Culture: NO GROWTH

## 2019-11-07 MED ORDER — AMOXICILLIN-POT CLAVULANATE 500-125 MG PO TABS
1.0000 | ORAL_TABLET | ORAL | Status: DC
  Administered 2019-11-07 – 2019-11-09 (×3): 500 mg via ORAL
  Filled 2019-11-07 (×3): qty 1

## 2019-11-07 NOTE — Progress Notes (Addendum)
PROGRESS NOTE    Ellen Skinner  GHW:[id number] DOB: 24-Nov-1995 DOA: 11/01/2019 PCP: System, Pcp Not In      Brief Narrative:  Ellen Skinner is a 24 y.o. F with scleroderma and recently diagnosed emphysema presented with cough and shortness of breath.  In the ER, CTA showed a large cavitary lesion with dense surrounding pneumonia, partially fluid filled, WBC 31K.          Assessment & Plan:  Acute renal failure Hyperkalemia Acute metabolic acidosis Cr 0.8 at baseline.  This developed after admission, in the context of vancomycin, Zosyn, Toradol, and iodine contrast plus some low blood pressure.    Potassium and acidosis improved.  Lasix started yesterday, and puffiness abdominal discomfort from fluid overload improved. -Continue Lasix -Strict I's and O's -Daily renal function -Continue bicarb  -Avoid hypotension or nephrotoxins -Renally dose medications with pharmacy assistance     Cavitating pneumonia This was the initial presenting complaint.  Overall this seems to be improving clinically, white blood cell count is down.  Unsure what the fever yesterday was from, will monitor. -Continue Augmentin, day 7 -Consult pulmonology, appreciate recommendations   Right flank pain Abd X-ray ordered, shows normal gas pattern, no stones.  Limited US of right kidney obtained, shows no change. -Acetaminophen or oxycodone as needed  Scleroderma  Emphysema   Anemia, mild -Trend CBC       Disposition: Status is: Inpatient  Remains inpatient appropriate because:Persistent severe electrolyte disturbances and renal failure   Dispo:  Patient From: Home  Planned Disposition: Home with Health Care Svc  Expected discharge date: Likely >3 days  Medically stable for discharge: No                   MDM: The below labs and imaging reports were reviewed and summarized above.  Medication management as above.    DVT prophylaxis: Place and maintain sequential  compression device Start: 11/05/19 1835  Code Status: FULL Family Communication: Mother at bedside    Consultants:   Pulmonology  Nephrology   Procedures:   8/4 bronchoscopy Findings:      The laryngeal mask airway is in good position. The vocal cords appear       normal. The subglottic space is normal. The trachea is of normal       caliber. The carina is sharp. The tracheobronchial tree was examined to       at least the first subsegmental level. Bronchial mucosa and anatomy are       normal; there are no endobronchial lesions, and no secretions.      Bronchoalveolar lavage was performed in the right middle lobe of the       lung and sent for cell count, bacterial culture, and fungal & AFB       analysis. 60 mL of fluid were instilled. 28 mL were returned. The return       was clear. There were no mucoid plugs in the return fluid. Impression:            - Right upper lobe pneumonia                        - Right middle lobe pneumonia                        - Right lower lobe pneumonia                        -  The airway examination was normal.                        - Bronchoalveolar lavage was performed in the right                         lower lobe to be sent for culture data.    Antimicrobials:    Vancomycin/Zosyn 8/2 >> 8/3  Unasyn 8/4 >> 8/7  Augmentin 8/7>>  Culture data:   8/2  Blood culture x1 ngtd  8/3 sputum culture --normal respiratory flora  8/4 BAL culture --no growth  8/4 BAL fungal culture -- pending          Subjective: Swelling is improved.  Abdominal fullness, she had some right flank pain today, improved with opiate.  No fever overnight.  No new cough, sputum, hemoptysis.  Urine output seems good.  Feels very fatigued.  Objective: Vitals:   11/06/19 0759 11/06/19 2220 11/07/19 0443 11/07/19 0748  BP: (!) 141/79 126/84  130/90  Pulse: (!) 108 (!) 115  98  Resp: _0 Temp: 99.3 F (37.4 C) (!) 101.4 F (38.6 C)  98.6  F (37 C)  TempSrc: Oral Oral    SpO2: 92% 93%  97%  Weight:   59.4 kg   Height:        Intake/Output Summary (Last 24 hours) at 11/07/2019 1601 Last data filed at 11/07/2019 1330 Gross per 24 hour  Intake 240 ml  Output 2500 ml  Net -2260 ml   Filed Weights   11/01/19 2243 11/05/19 0500 11/07/19 0443  Weight: 51.3 kg 60.8 kg 59.4 kg    Examination: General appearance: Thin adult female, lying in bed, appears more comfortable today     HEENT: Mouse like facies, no nasal discharge, or epistaxis.  Oropharynx moist, no oral lesions. Skin: Skin induration in the extremities is noted Cardiac: RRR, no murmurs, edema is resolved  Respiratory: Respiratory effort normal, lung sounds diminished but no rales or wheezing, lung sounds very diminished on the right Abdomen: Abdomen soft and nontender, no ascites or distention MSK: Sclerodactyly Neuro: A awake and alert, extraocular moments intact, moves all extremities with generalized weakness but symmetric urination, speech fluent Psych: A attention normal, affect normal, judgment insight appear normal            Data Reviewed: I have personally reviewed following labs and imaging studies:  CBC: Recent Labs  Lab 11/02/19 0405 11/03/19 0111 11/04/19 0205 11/05/19 0657 11/06/19 0110  WBC 29.7* 34.2* 38.4* 27.5* 23.2*  HGB 10.9* 10.7* 12.6 12.0 10.3*  HCT 34.2* 33.7* 39.0 37.8 30.4*  MCV 93.2 91.1 90.5 93.3 86.9  PLT 357 342 391 284 130   Basic Metabolic Panel: Recent Labs  Lab 11/04/19 0205 11/05/19 0657 11/05/19 1700 11/06/19 0110 11/07/19 0121  NA 136 136 136 136 138  K 5.1 6.0* 5.1 4.2 3.9  CL 103 109 104 100 98  CO2 17* 12* 18* 21* 24  GLUCOSE 154* 82 96 118* 103*  BUN 33* 52* 54* 53* 56*  CREATININE 5.36* 7.51* 8.19* 8.49* 9.86*  CALCIUM 8.1* 7.7* 7.6* 7.4* 7.8*  PHOS 5.8* 5.4* 6.1* 5.7* 8.3*   GFR: Estimated Creatinine Clearance: 7.4 mL/min (A) (by C-G formula based on SCr of 9.86 mg/dL (H)). Liver  Function Tests: Recent Labs  Lab 11/03/19 0111 11/03/19 0111 11/04/19 0205 11/05/19 0657 11/05/19 1700 11/06/19 0110 11/07/19 0121  AST  23  --   --   --   --   --   --   ALT 26  --   --   --   --   --   --   ALKPHOS 165*  --   --   --   --   --   --   BILITOT 1.6*  --   --   --   --   --   --   PROT 7.7  --   --   --   --   --   --   ALBUMIN 1.9*   < > 1.8* 1.4* 1.4* 1.4* 1.5*   < > = values in this interval not displayed.   No results for input(s): LIPASE, AMYLASE in the last 168 hours. No results for input(s): AMMONIA in the last 168 hours. Coagulation Profile: Recent Labs  Lab 11/03/19 0111  INR 1.3*   Cardiac Enzymes: No results for input(s): CKTOTAL, CKMB, CKMBINDEX, TROPONINI in the last 168 hours. BNP (last 3 results) No results for input(s): PROBNP in the last 8760 hours. HbA1C: No results for input(s): HGBA1C in the last 72 hours. CBG: No results for input(s): GLUCAP in the last 168 hours. Lipid Profile: No results for input(s): CHOL, HDL, LDLCALC, TRIG, CHOLHDL, LDLDIRECT in the last 72 hours. Thyroid Function Tests: No results for input(s): TSH, T4TOTAL, FREET4, T3FREE, THYROIDAB in the last 72 hours. Anemia Panel: No results for input(s): VITAMINB12, FOLATE, FERRITIN, TIBC, IRON, RETICCTPCT in the last 72 hours. Urine analysis:    Component Value Date/Time   COLORURINE AMBER (A) 11/03/2019 1100   APPEARANCEUR CLOUDY (A) 11/03/2019 1100   LABSPEC 1.018 11/03/2019 1100   PHURINE 5.0 11/03/2019 1100   GLUCOSEU NEGATIVE 11/03/2019 1100   HGBUR NEGATIVE 11/03/2019 1100   BILIRUBINUR NEGATIVE 11/03/2019 1100   KETONESUR NEGATIVE 11/03/2019 1100   PROTEINUR 100 (A) 11/03/2019 1100   NITRITE NEGATIVE 11/03/2019 1100   LEUKOCYTESUR NEGATIVE 11/03/2019 1100   Sepsis Labs: _0 (procalcitonin:4,lacticacidven:4)  ) Recent Results (from the past 240 hour(s))  SARS Coronavirus 2 by RT PCR (hospital order, performed in [place] hospital lab)  Nasopharyngeal Nasopharyngeal Swab     Status: None   Collection Time: 11/02/19  2:48 AM   Specimen: Nasopharyngeal Swab  Result Value Ref Range Status   SARS Coronavirus 2 NEGATIVE NEGATIVE Final    Comment: (NOTE) SARS-CoV-2 target nucleic acids are NOT DETECTED.  The SARS-CoV-2 RNA is generally detectable in upper and lower respiratory specimens during the acute phase of infection. The lowest concentration of SARS-CoV-2 viral copies this assay can detect is 250 copies / mL. A negative result does not preclude SARS-CoV-2 infection and should not be used as the sole basis for treatment or other patient management decisions.  A negative result may occur with improper specimen collection / handling, submission of specimen other than nasopharyngeal swab, presence of viral mutation(s) within the areas targeted by this assay, and inadequate number of viral copies (<250 copies / mL). A negative result must be combined with clinical observations, patient history, and epidemiological information.  Fact Sheet for Patients:   StrictlyIdeas.no  Fact Sheet for Healthcare Providers: BankingDealers.co.za  This test is not yet approved or  cleared by the [place] FDA and has been authorized for detection and/or diagnosis of SARS-CoV-2 by FDA under an Emergency Use Authorization (EUA).  This EUA will remain in effect (meaning this test can be used) for the duration of the COVID-19 declaration  under Section 564(b)(1) of the Act, 21 U.S.C. section 360bbb-3(b)(1), unless the authorization is terminated or revoked sooner.  Performed at [facility], [place] [address] [place], [place] [address]   MRSA PCR Screening     Status: None   Collection Time: 11/02/19  4:05 AM   Specimen: Nasopharyngeal  Result Value Ref Range Status   MRSA by PCR NEGATIVE NEGATIVE Final    Comment:        The GeneXpert MRSA Assay (FDA approved for NASAL  specimens only), is one component of a comprehensive MRSA colonization surveillance program. It is not intended to diagnose MRSA infection nor to guide or monitor treatment for MRSA infections. Performed at [facility], [place] [address], [place], [place] [address]   Culture, blood (routine x 2) Call MD if unable to obtain prior to antibiotics being given     Status: None   Collection Time: 11/02/19  4:40 AM   Specimen: BLOOD  Result Value Ref Range Status   Specimen Description BLOOD RIGHT ARM  Final   Special Requests   Final    BOTTLES DRAWN AEROBIC AND ANAEROBIC Blood Culture results may not be optimal due to an inadequate volume of blood received in culture bottles   Culture   Final    NO GROWTH 5 DAYS Performed at [facility], [place] [address], [place], [place] [address]    Report Status 11/07/2019 FINAL  Final  Culture, sputum-assessment     Status: None   Collection Time: 11/02/19  8:31 AM   Specimen: Expectorated Sputum  Result Value Ref Range Status   Specimen Description Expect. Sput  Final   Special Requests NONE  Final   Sputum evaluation   Final    THIS SPECIMEN IS ACCEPTABLE FOR SPUTUM CULTURE Performed at [facility], [address], [place], [place] [address]    Report Status 11/02/2019 FINAL  Final  Culture, respiratory     Status: None   Collection Time: 11/02/19  8:31 AM  Result Value Ref Range Status   Specimen Description Expect. Sput  Final   Special Requests NONE Reflexed from [id number]  Final   Gram Stain   Final    ABUNDANT WBC PRESENT,BOTH PMN AND MONONUCLEAR FEW GRAM NEGATIVE RODS MODERATE GRAM POSITIVE COCCI ABUNDANT GRAM VARIABLE ROD    Culture   Final    FEW Normal respiratory flora-no Staph aureus or Pseudomonas seen Performed at [facility], [address] [place], [place] [address]    Report Status 11/04/2019 FINAL  Final  Culture, respiratory     Status: None   Collection Time: 11/03/19 10:51 AM   Specimen:  PATH Cytology Bronchial lavage; Respiratory  Result Value Ref Range Status   Specimen Description Bronch Lavag  Final   Special Requests NONE  Final   Gram Stain   Final    RARE WBC PRESENT,BOTH PMN AND MONONUCLEAR NO ORGANISMS SEEN    Culture   Final    NO GROWTH 2 DAYS Performed at [facility], [address], [place], [place] [address]    Report Status 11/05/2019 FINAL  Final  Fungus Culture With Stain     Status: None (Preliminary result)   Collection Time: 11/03/19 10:58 AM   Specimen: PATH Cytology Bronchial lavage  Result Value Ref Range Status   Fungus Stain Final report  Final    Comment: (NOTE) Performed At: [facility] [address] [place], [place] [id number]  Rush Farmer MD BR:[id number]    Fungus (Mycology) Culture PENDING  Incomplete   Fungal Source BRONCHIAL ALVEOLAR LAVAGE  Final    Comment: Performed at [facility], [place] [address], [place], [place] [address]  Fungus Culture Result     Status: None   Collection Time: 11/03/19 10:58 AM  Result Value Ref Range Status   Result 1 Comment  Final    Comment: (NOTE) KOH/Calcofluor preparation:  no fungus observed. Performed At: [facility] [place], [place] [id number] Rush Farmer MD YT:[id number]          Radiology Studies: US RENAL  Result Date: 11/07/2019 CLINICAL DATA:  Inpatient.  Right flank pain for 1 day. EXAM: RENAL / URINARY TRACT ULTRASOUND COMPLETE COMPARISON:  10/31/2019 renal sonogram. FINDINGS: Right Kidney: Renal measurements: 13.8 x 6.2 x 5.9 cm = volume: 263 mL. Echogenic right renal parenchyma, normal thickness. No hydronephrosis. No renal mass. Left Kidney: Renal measurements: 12.2 x 7.3 x 7.4 cm = volume: 345 mL. Echogenic left renal parenchyma, normal thickness. No hydronephrosis. No renal mass. Bladder: Appears normal for degree of bladder distention. Other: Bilateral ureteral jets seen in the bladder. IMPRESSION: 1. No hydronephrosis. 2.  Echogenic kidneys, unchanged, indicative of nonspecific renal parenchymal disease. 3. Normal bladder. Electronically Signed   By: Ilona Sorrel M.D.   On: 11/07/2019 12:34   DG Abd Portable 1V  Result Date: 11/07/2019 CLINICAL DATA:  Flank pain EXAM: PORTABLE ABDOMEN - 1 VIEW COMPARISON:  None. FINDINGS: No dilated small bowel loops. Mild colonic stool and gas. No evidence of pneumatosis or pneumoperitoneum. No radiopaque nephrolithiasis. IMPRESSION: Nonobstructive bowel gas pattern. Electronically Signed   By: Ilona Sorrel M.D.   On: 11/07/2019 12:35        Scheduled Meds:  amoxicillin-clavulanate  1 tablet Oral Q24H   chlorpheniramine-HYDROcodone  5 mL Oral Q12H   feeding supplement (ENSURE ENLIVE)  237 mL Oral BID BM   furosemide  80 mg Intravenous Q12H   sodium bicarbonate  650 mg Oral BID   sodium chloride flush  3 mL Intravenous Once   Continuous Infusions:    LOS: 5 days    Time spent: 35 minutes    Edwin Dada, MD [facility] 11/07/2019, 4:01 PM     Please page though [place] or Epic secure chat:  For [employer], Adult nurse

## 2019-11-07 NOTE — Progress Notes (Signed)
Subjective:  Hemodynamically stable-  2400 of UOP recorded, overall neg 1800 -  BUN and crt slightly worse -  K  OK  and bicarb up.  Her bloating is better but feels very tired "I havent even checked the messages on my phone"    Objective Vital signs in last 24 hours: Vitals:   11/06/19 0759 11/06/19 2220 11/07/19 0443 11/07/19 0748  BP: (!) 141/79 126/84  130/90  Pulse: (!) 108 (!) 115  98  Resp: 18 20  16   Temp: 99.3 F (37.4 C) (!) 101.4 F (38.6 C)  98.6 F (37 C)  TempSrc: Oral Oral    SpO2: 92% 93%  97%  Weight:   59.4 kg   Height:       Weight change:   Intake/Output Summary (Last 24 hours) at 11/07/2019 01/07/2020 Last data filed at 11/07/2019 0416 Gross per 24 hour  Intake 480 ml  Output 2100 ml  Net -1620 ml    Assessment/ Plan: Pt is a 24 y.o. yo female with scleroderma and emphysema who was admitted on 11/01/2019 with PNA-  Has suffered AKI  Assessment/Plan: 1. Renal-  AKI with baseline crt less than 1.  Time course c/w contrast induced nephropathy-  Urine with 100 of protein and no RBCs. Numbers worsened again  -  Hoping that renal function is trying to plateau. K is down out of danger range.  Is nonoliguric and not overly uremic-  Still hopeful for renal recovery-  Cont to watch without dialysis for now  2. Hyperkalemia-  Better with conservative measures-  stopped lokelma  3. Anemia- not a major issue 4. Metabolic acidosis-  Due to AKI-  Cont oral bicarb-    5. HTN/volume-  BP is perfect - no meds.  With the IVF is feeling bloated- stopped IVF and continue with lasix 6. PNA- unasyn- now augmentin - per primary team-  WBC decreasing    01/01/2020    Labs: Basic Metabolic Panel: Recent Labs  Lab 11/05/19 1700 11/06/19 0110 11/07/19 0121  NA 136 136 138  K 5.1 4.2 3.9  CL 104 100 98  CO2 18* 21* 24  GLUCOSE 96 118* 103*  BUN 54* 53* 56*  CREATININE 8.19* 8.49* 9.86*  CALCIUM 7.6* 7.4* 7.8*  PHOS 6.1* 5.7* 8.3*   Liver Function Tests: Recent Labs   Lab 11/03/19 0111 11/04/19 0205 11/05/19 1700 11/06/19 0110 11/07/19 0121  AST 23  --   --   --   --   ALT 26  --   --   --   --   ALKPHOS 165*  --   --   --   --   BILITOT 1.6*  --   --   --   --   PROT 7.7  --   --   --   --   ALBUMIN 1.9*   < > 1.4* 1.4* 1.5*   < > = values in this interval not displayed.   No results for input(s): LIPASE, AMYLASE in the last 168 hours. No results for input(s): AMMONIA in the last 168 hours. CBC: Recent Labs  Lab 11/02/19 0405 11/02/19 0405 11/03/19 0111 11/03/19 0111 11/04/19 0205 11/05/19 0657 11/06/19 0110  WBC 29.7*   < > 34.2*   < > 38.4* 27.5* 23.2*  HGB 10.9*   < > 10.7*   < > 12.6 12.0 10.3*  HCT 34.2*   < > 33.7*   < > 39.0 37.8 30.4*  MCV 93.2  --  91.1  --  90.5 93.3 86.9  PLT 357   < > 342   < > 391 284 330   < > = values in this interval not displayed.   Cardiac Enzymes: No results for input(s): CKTOTAL, CKMB, CKMBINDEX, TROPONINI in the last 168 hours. CBG: No results for input(s): GLUCAP in the last 168 hours.  Iron Studies: No results for input(s): IRON, TIBC, TRANSFERRIN, FERRITIN in the last 72 hours. Studies/Results: No results found. Medications: Infusions:   Scheduled Medications: . amoxicillin-clavulanate  1 tablet Oral Q24H  . chlorpheniramine-HYDROcodone  5 mL Oral Q12H  . feeding supplement (ENSURE ENLIVE)  237 mL Oral BID BM  . furosemide  80 mg Intravenous Q12H  . sodium bicarbonate  650 mg Oral BID  . sodium chloride flush  3 mL Intravenous Once    have reviewed scheduled and prn medications.  Physical Exam: General: alert, feels better-   Mother at bedside  Heart: RRR Lungs: CBS bilat  Abdomen: distended-  Exam is better  Extremities: some edema    11/07/2019,8:22 AM  LOS: 5 days

## 2019-11-07 NOTE — Progress Notes (Signed)
   11/07/19 2004  Assess: MEWS Score  Temp (!) 100.6 F (38.1 C) (not. nurse)  BP 134/79  Pulse Rate (!) 114  Resp 20  SpO2 97 %  O2 Device Room Air  Assess: MEWS Score  MEWS Temp 1  MEWS Systolic 0  MEWS Pulse 2  MEWS RR 0  MEWS LOC 0  MEWS Score 3  MEWS Score Color Yellow  Assess: if the MEWS score is Yellow or Red  Were vital signs taken at a resting state? Yes  Focused Assessment No change from prior assessment  Early Detection of Sepsis Score *See Row Information* Low  MEWS guidelines implemented *See Row Information* No, vital signs rechecked  Treat  MEWS Interventions Administered prn meds/treatments  Take Vital Signs  Increase Vital Sign Frequency  Yellow: Q 2hr X 2 then Q 4hr X 2, if remains yellow, continue Q 4hrs  Escalate  MEWS: Escalate Yellow: discuss with charge nurse/RN and consider discussing with provider and RRT  Document  Patient Outcome Stabilized after interventions  Progress note created (see row info) Yes  MEWS 3 due to fever. This is not an acute change, pt had fever previous night. MD aware. Pt had oxy with acetaminophen at 2014. On reassessment temp 99.1, and HR 109. [place], CN notified.  Will continue to monitor pt closely.

## 2019-11-07 NOTE — Plan of Care (Signed)

## 2019-11-07 NOTE — Progress Notes (Signed)
PHARMACY NOTE:  ANTIMICROBIAL RENAL DOSAGE ADJUSTMENT  Current antimicrobial regimen includes a mismatch between antimicrobial dosage and estimated renal function.  As per policy approved by the Pharmacy & Therapeutics and Medical Executive Committees, the antimicrobial dosage will be adjusted accordingly.  Current antimicrobial dosage:  Augmentin 875mg  PO Q24H  Indication: necrotizing PNA with probable abscess  Renal Function:  approaching dialysis  Estimated Creatinine Clearance: 7.4 mL/min (A) (by C-G formula based on SCr of 9.86 mg/dL (H)). []      On intermittent HD, scheduled: []      On CRRT    Antimicrobial dosage has been changed to:  Augmentin 500mg  PO Q24H at 1800  Additional comments: monitor clinical status and could increase to Q12H if needed.   Thank you for allowing pharmacy to be a part of this patient's care.  Gerrad Welker D. , PharmD, BCPS, BCCCP 11/07/2019, 2:00 PM

## 2019-11-08 DIAGNOSIS — J850 Gangrene and necrosis of lung: Secondary | ICD-10-CM | POA: Diagnosis not present

## 2019-11-08 DIAGNOSIS — N179 Acute kidney failure, unspecified: Secondary | ICD-10-CM | POA: Diagnosis not present

## 2019-11-08 LAB — RENAL FUNCTION PANEL
Albumin: 2 g/dL — ABNORMAL LOW (ref 3.5–5.0)
Anion gap: 20 — ABNORMAL HIGH (ref 5–15)
BUN: 58 mg/dL — ABNORMAL HIGH (ref 6–20)
CO2: 24 mmol/L (ref 22–32)
Calcium: 8.2 mg/dL — ABNORMAL LOW (ref 8.9–10.3)
Chloride: 96 mmol/L — ABNORMAL LOW (ref 98–111)
Creatinine, Ser: 9.52 mg/dL — ABNORMAL HIGH (ref 0.44–1.00)
GFR calc Af Amer: 6 mL/min — ABNORMAL LOW (ref >60)
GFR calc non Af Amer: 5 mL/min — ABNORMAL LOW (ref >60)
Glucose, Bld: 97 mg/dL (ref 70–99)
Phosphorus: 8.8 mg/dL — ABNORMAL HIGH (ref 2.5–4.6)
Potassium: 4.2 mmol/L (ref 3.5–5.1)
Sodium: 140 mmol/L (ref 135–145)

## 2019-11-08 LAB — CBC
HCT: 36.3 % (ref 36.0–46.0)
Hemoglobin: 11.9 g/dL — ABNORMAL LOW (ref 12.0–15.0)
MCH: 28.9 pg (ref 26.0–34.0)
MCHC: 32.8 g/dL (ref 30.0–36.0)
MCV: 88.1 fL (ref 80.0–100.0)
Platelets: 289 10*3/uL (ref 150–400)
RBC: 4.12 MIL/uL (ref 3.87–5.11)
RDW: 13.7 % (ref 11.5–15.5)
WBC: 20.5 10*3/uL — ABNORMAL HIGH (ref 4.0–10.5)
nRBC: 0 % (ref 0.0–0.2)

## 2019-11-08 MED ORDER — NYSTATIN 100000 UNIT/ML MT SUSP
5.0000 mL | Freq: Four times a day (QID) | OROMUCOSAL | Status: DC
  Administered 2019-11-08 – 2019-11-14 (×21): 500000 [IU] via ORAL
  Filled 2019-11-08 (×19): qty 5

## 2019-11-08 MED ORDER — POLYETHYLENE GLYCOL 3350 17 G PO PACK
17.0000 g | PACK | Freq: Every day | ORAL | Status: DC
  Administered 2019-11-08 – 2019-11-09 (×2): 17 g via ORAL
  Filled 2019-11-08 (×3): qty 1

## 2019-11-08 NOTE — Progress Notes (Addendum)
PROGRESS NOTE    Ellen Skinner  WUJ:[id number] DOB: [birth year] DOA: 11/01/2019 PCP: System, Pcp Not In      Brief Narrative:  Ellen Skinner is a 24 y.o. F with scleroderma and recently diagnosed emphysema presented with cough and shortness of breath.  In the ER, CTA showed a large cavitary lesion with dense surrounding pneumonia, partially fluid filled, WBC 31K.          Assessment & Plan:  Acute renal failure Hyperkalemia Acute metabolic acidosis Cr 0.8 at baseline.  This developed after admission, in the context of vancomycin, Zosyn, Toradol, and iodine contrast plus some low blood pressure.    Acidosis, K, and fluid overload seem to be somewhat better with temporizing measures.   -Continue Lasix -Continue bicarb -Strict I's and O's -Daily renal function -Avoid hypotension or nephrotoxins -Renally dose medications with pharmacy assistance     Cavitating pneumonia This was the initial presenting complaint.  Overall this seems to be improving clinically  White cell count, continues to improve overall fever curve improving as well. -Continue Augmentin, day eight of antibiotic -Consult pulmonology, appreciate recommendations, they recommend follow-up in a tertiary care center after discharge    Scleroderma  Emphysema   Anemia, mild Hemoglobin stable -Trend CBC  Thrush Patient described oral pain, odynophagia. -Start nystatin swish and swallow            Disposition: Status is: Inpatient  Remains inpatient appropriate because:Persistent severe electrolyte disturbances and renal failure   Dispo:  Patient From: Home  Planned Disposition: Home with Health Care Svc  Expected discharge date: Likely >3 days  Medically stable for discharge: No                   MDM: The below labs and imaging reports were reviewed and summarized above.  Medication management as above.    DVT prophylaxis: Place and maintain sequential compression  device Start: 11/05/19 1835  Code Status: FULL Family Communication: Mother at bedside    Consultants:   Pulmonology  Nephrology   Procedures:   83 CTA chest -- no PE,  Large cavitary process involving majority of the right lower lobe and most of the right middle lobe. There are multiple internal fluid levels within the cavitary process which appears to localize to the lung parenchyma as opposed to the pleural space. Suspect necrotizing pneumonia with probable pulmonary abscess. There is dense consolidation within the right middle and lower lobes    8/5 US renal -- medicorenal diseasse, no hydronephrosis   8/4 bronchoscopy Findings:      The laryngeal mask airway is in good position. The vocal cords appear       normal. The subglottic space is normal. The trachea is of normal       caliber. The carina is sharp. The tracheobronchial tree was examined to       at least the first subsegmental level. Bronchial mucosa and anatomy are       normal; there are no endobronchial lesions, and no secretions.      Bronchoalveolar lavage was performed in the right middle lobe of the       lung and sent for cell count, bacterial culture, and fungal & AFB       analysis. 60 mL of fluid were instilled. 28 mL were returned. The return       was clear. There were no mucoid plugs in the return fluid. Impression:            -  Right upper lobe pneumonia                        - Right middle lobe pneumonia                        - Right lower lobe pneumonia                        - The airway examination was normal.                        - Bronchoalveolar lavage was performed in the right                         lower lobe to be sent for culture data.    Antimicrobials:    Vancomycin/Zosyn 8/2 >> 8/3  Unasyn 8/4 >> 8/7  Augmentin 8/7>>  Culture data:   8/2  Blood culture x1 ngtd  8/3 sputum culture --normal respiratory flora  8/4 BAL culture --no growth  8/4 BAL fungal  culture -- pending          Subjective: Her swelling is improved, she still has some abdominal discomfort and constipation.Low fever overnight. No chest pain, vomiting, hemoptysis.  Objective: Vitals:   11/08/19 0254 11/08/19 0500 11/08/19 0801 11/08/19 1615  BP: 121/77  126/76 123/68  Pulse: 98  (!) 108 97  Resp: 20     Temp: 98.5 F (36.9 C)  99.9 F (37.7 C) 98.7 F (37.1 C)  TempSrc: Oral     SpO2: 93%  96% 95%  Weight:  54.5 kg    Height:        Intake/Output Summary (Last 24 hours) at 11/08/2019 1654 Last data filed at 11/08/2019 0600 Gross per 24 hour  Intake 720 ml  Output 2000 ml  Net -1280 ml   Filed Weights   11/07/19 0443 11/07/19 2148 11/08/19 0500  Weight: 59.4 kg 49.5 kg 54.5 kg    Examination: General appearance: Thin adult female, lying in bed, appears slightly uncomfortable, no acute distress, interactive     HEENT: Mouse-like facies, no nasal discharge, no epistaxis. Oropharynx moist, no oral lesions Skin: Skin induration of the hands, sclerodactyly noted. Cardiac: RRR, no murmurs, edema is improved Respiratory: Tory effort normal, lung sounds diminished on the right, no rales or wheezing. Abdomen: Abdomen diffusely tender, no guarding or rebound, no rigidity. No ascites or distention. MSK: Sclerodactyly Neuro: Awake alert, extraocular movements intact, moves all extremities with generalized weakness but symmetric strength, speech fluent Psych: Attention normal, affect normal, judgment insight appear normal                Data Reviewed: I have personally reviewed following labs and imaging studies:  CBC: Recent Labs  Lab 11/03/19 0111 11/04/19 0205 11/05/19 0657 11/06/19 0110 11/08/19 0242  WBC 34.2* 38.4* 27.5* 23.2* 20.5*  HGB 10.7* 12.6 12.0 10.3* 11.9*  HCT 33.7* 39.0 37.8 30.4* 36.3  MCV 91.1 90.5 93.3 86.9 88.1  PLT 342 391 284 330 322   Basic Metabolic Panel: Recent Labs  Lab 11/05/19 0657 11/05/19 1700  11/06/19 0110 11/07/19 0121 11/08/19 0242  NA 136 136 136 138 140  K 6.0* 5.1 4.2 3.9 4.2  CL 109 104 100 98 96*  CO2 12* 18* 21* 24 24  GLUCOSE 82 96 118* 103* 97  BUN 52* 54*  53* 56* 58*  CREATININE 7.51* 8.19* 8.49* 9.86* 9.52*  CALCIUM 7.7* 7.6* 7.4* 7.8* 8.2*  PHOS 5.4* 6.1* 5.7* 8.3* 8.8*   GFR: Estimated Creatinine Clearance: 7 mL/min (A) (by C-G formula based on SCr of 9.52 mg/dL (H)). Liver Function Tests: Recent Labs  Lab 11/03/19 0111 11/04/19 0205 11/05/19 0657 11/05/19 1700 11/06/19 0110 11/07/19 0121 11/08/19 0242  AST 23  --   --   --   --   --   --   ALT 26  --   --   --   --   --   --   ALKPHOS 165*  --   --   --   --   --   --   BILITOT 1.6*  --   --   --   --   --   --   PROT 7.7  --   --   --   --   --   --   ALBUMIN 1.9*   < > 1.4* 1.4* 1.4* 1.5* 2.0*   < > = values in this interval not displayed.   No results for input(s): LIPASE, AMYLASE in the last 168 hours. No results for input(s): AMMONIA in the last 168 hours. Coagulation Profile: Recent Labs  Lab 11/03/19 0111  INR 1.3*   Cardiac Enzymes: No results for input(s): CKTOTAL, CKMB, CKMBINDEX, TROPONINI in the last 168 hours. BNP (last 3 results) No results for input(s): PROBNP in the last 8760 hours. HbA1C: No results for input(s): HGBA1C in the last 72 hours. CBG: No results for input(s): GLUCAP in the last 168 hours. Lipid Profile: No results for input(s): CHOL, HDL, LDLCALC, TRIG, CHOLHDL, LDLDIRECT in the last 72 hours. Thyroid Function Tests: No results for input(s): TSH, T4TOTAL, FREET4, T3FREE, THYROIDAB in the last 72 hours. Anemia Panel: No results for input(s): VITAMINB12, FOLATE, FERRITIN, TIBC, IRON, RETICCTPCT in the last 72 hours. Urine analysis:    Component Value Date/Time   COLORURINE AMBER (A) 11/03/2019 1100   APPEARANCEUR CLOUDY (A) 11/03/2019 1100   LABSPEC 1.018 11/03/2019 1100   PHURINE 5.0 11/03/2019 1100   GLUCOSEU NEGATIVE 11/03/2019 1100   HGBUR  NEGATIVE 11/03/2019 1100   BILIRUBINUR NEGATIVE 11/03/2019 1100   KETONESUR NEGATIVE 11/03/2019 1100   PROTEINUR 100 (A) 11/03/2019 1100   NITRITE NEGATIVE 11/03/2019 1100   LEUKOCYTESUR NEGATIVE 11/03/2019 1100   Sepsis Labs: _0 (procalcitonin:4,lacticacidven:4)  ) Recent Results (from the past 240 hour(s))  SARS Coronavirus 2 by RT PCR (hospital order, performed in [facility] hospital lab) Nasopharyngeal Nasopharyngeal Swab     Status: None   Collection Time: 11/02/19  2:48 AM   Specimen: Nasopharyngeal Swab  Result Value Ref Range Status   SARS Coronavirus 2 NEGATIVE NEGATIVE Final    Comment: (NOTE) SARS-CoV-2 target nucleic acids are NOT DETECTED.  The SARS-CoV-2 RNA is generally detectable in upper and lower respiratory specimens during the acute phase of infection. The lowest concentration of SARS-CoV-2 viral copies this assay can detect is 250 copies / mL. A negative result does not preclude SARS-CoV-2 infection and should not be used as the sole basis for treatment or other patient management decisions.  A negative result may occur with improper specimen collection / handling, submission of specimen other than nasopharyngeal swab, presence of viral mutation(s) within the areas targeted by this assay, and inadequate number of viral copies (<250 copies / mL). A negative result must be combined with clinical observations, patient history, and epidemiological information.  Fact  Sheet for Patients:   StrictlyIdeas.no  Fact Sheet for Healthcare Providers: BankingDealers.co.za  This test is not yet approved or  cleared by the [place] FDA and has been authorized for detection and/or diagnosis of SARS-CoV-2 by FDA under an Emergency Use Authorization (EUA).  This EUA will remain in effect (meaning this test can be used) for the duration of the COVID-19 declaration under Section 564(b)(1) of the Act, 21  U.S.C. section 360bbb-3(b)(1), unless the authorization is terminated or revoked sooner.  Performed at [facility], [place] [address] [place], [place] [address]   MRSA PCR Screening     Status: None   Collection Time: 11/02/19  4:05 AM   Specimen: Nasopharyngeal  Result Value Ref Range Status   MRSA by PCR NEGATIVE NEGATIVE Final    Comment:        The GeneXpert MRSA Assay (FDA approved for NASAL specimens only), is one component of a comprehensive MRSA colonization surveillance program. It is not intended to diagnose MRSA infection nor to guide or monitor treatment for MRSA infections. Performed at [facility], [place] [address], [place], [place] [address]   Culture, blood (routine x 2) Call MD if unable to obtain prior to antibiotics being given     Status: None   Collection Time: 11/02/19  4:40 AM   Specimen: BLOOD  Result Value Ref Range Status   Specimen Description BLOOD RIGHT ARM  Final   Special Requests   Final    BOTTLES DRAWN AEROBIC AND ANAEROBIC Blood Culture results may not be optimal due to an inadequate volume of blood received in culture bottles   Culture   Final    NO GROWTH 5 DAYS Performed at [facility], [place] [address], [place], [place] [address]    Report Status 11/07/2019 FINAL  Final  Culture, sputum-assessment     Status: None   Collection Time: 11/02/19  8:31 AM   Specimen: Expectorated Sputum  Result Value Ref Range Status   Specimen Description Expect. Sput  Final   Special Requests NONE  Final   Sputum evaluation   Final    THIS SPECIMEN IS ACCEPTABLE FOR SPUTUM CULTURE Performed at [facility], [address], [place], [place] [address]    Report Status 11/02/2019 FINAL  Final  Culture, respiratory     Status: None   Collection Time: 11/02/19  8:31 AM  Result Value Ref Range Status   Specimen Description Expect. Sput  Final   Special Requests NONE Reflexed from [id number]  Final   Gram Stain   Final    ABUNDANT WBC  PRESENT,BOTH PMN AND MONONUCLEAR FEW GRAM NEGATIVE RODS MODERATE GRAM POSITIVE COCCI ABUNDANT GRAM VARIABLE ROD    Culture   Final    FEW Normal respiratory flora-no Staph aureus or Pseudomonas seen Performed at [facility], [address], [place], [place] [address]    Report Status 11/04/2019 FINAL  Final  Culture, respiratory     Status: None   Collection Time: 11/03/19 10:51 AM   Specimen: PATH Cytology Bronchial lavage; Respiratory  Result Value Ref Range Status   Specimen Description Bronch Lavag  Final   Special Requests NONE  Final   Gram Stain   Final    RARE WBC PRESENT,BOTH PMN AND MONONUCLEAR NO ORGANISMS SEEN    Culture   Final    NO GROWTH 2 DAYS Performed at [facility], [address], [place], [place] [address]  Report Status 11/05/2019 FINAL  Final  Fungus Culture With Stain     Status: None (Preliminary result)   Collection Time: 11/03/19 10:58 AM   Specimen: PATH Cytology Bronchial lavage  Result Value Ref Range Status   Fungus Stain Final report  Final    Comment: (NOTE) Performed At: [facility] [place], [place] [id number] Rush Farmer MD PX:[id number]    Fungus (Mycology) Culture PENDING  Incomplete   Fungal Source BRONCHIAL ALVEOLAR LAVAGE  Final    Comment: Performed at [facility], [place] [address], [place], [place] [address]  Fungus Culture Result     Status: None   Collection Time: 11/03/19 10:58 AM  Result Value Ref Range Status   Result 1 Comment  Final    Comment: (NOTE) KOH/Calcofluor preparation:  no fungus observed. Performed At: [facility] [place], [place] [id number] Rush Farmer MD XH:[id number]          Radiology Studies: US RENAL  Result Date: 11/07/2019 CLINICAL DATA:  Inpatient.  Right flank pain for 1 day. EXAM: RENAL / URINARY TRACT ULTRASOUND COMPLETE COMPARISON:  10/31/2019 renal sonogram. FINDINGS: Right Kidney: Renal measurements: 13.8 x 6.2 x  5.9 cm = volume: 263 mL. Echogenic right renal parenchyma, normal thickness. No hydronephrosis. No renal mass. Left Kidney: Renal measurements: 12.2 x 7.3 x 7.4 cm = volume: 345 mL. Echogenic left renal parenchyma, normal thickness. No hydronephrosis. No renal mass. Bladder: Appears normal for degree of bladder distention. Other: Bilateral ureteral jets seen in the bladder. IMPRESSION: 1. No hydronephrosis. 2. Echogenic kidneys, unchanged, indicative of nonspecific renal parenchymal disease. 3. Normal bladder. Electronically Signed   By: Ilona Sorrel M.D.   On: 11/07/2019 12:34   DG Abd Portable 1V  Result Date: 11/07/2019 CLINICAL DATA:  Flank pain EXAM: PORTABLE ABDOMEN - 1 VIEW COMPARISON:  None. FINDINGS: No dilated small bowel loops. Mild colonic stool and gas. No evidence of pneumatosis or pneumoperitoneum. No radiopaque nephrolithiasis. IMPRESSION: Nonobstructive bowel gas pattern. Electronically Signed   By: Ilona Sorrel M.D.   On: 11/07/2019 12:35        Scheduled Meds: . amoxicillin-clavulanate  1 tablet Oral Q24H  . chlorpheniramine-HYDROcodone  5 mL Oral Q12H  . feeding supplement (ENSURE ENLIVE)  237 mL Oral BID BM  . furosemide  80 mg Intravenous Q12H  . polyethylene glycol  17 g Oral Daily  . sodium bicarbonate  650 mg Oral BID  . sodium chloride flush  3 mL Intravenous Once   Continuous Infusions:    LOS: 6 days    Time spent: 35 minutes    Edwin Dada, MD [facility] 11/08/2019, 4:54 PM     Please page though [place] or Epic secure chat:  For [employer], Adult nurse

## 2019-11-08 NOTE — Progress Notes (Signed)
[facility] Progress Note   24 y.o. yo female with scleroderma and emphysema who was admitted on 11/01/2019 with PNA-  Has suffered AKI   Assessment/ Plan:   1. Renal-  AKI with baseline crt less than 1.  Time course c/w contrast induced nephropathy-  Urine with 100 of protein and no RBCs. Numbers worsened again   Renal function may be finally  Plateauing.  Good UOP.  Will follow for another 24-48hr.  2. Hyperkalemia-  Better with conservative measures; resolved 3. Anemia- not a major issue 4. Metabolic acidosis-  Due to AKI-  Cont oral bicarb-    5. HTN/volume-  BP is perfect - no meds.  With the IVF is feeling bloated- stopped IVF and continue with lasix 6. PNA- unasyn- now augmentin - per primary team-  WBC decreasing    Subjective:   Feeling better and denies f/c/n/v.  NP cough   Objective:   BP 126/76   Pulse (!) 108   Temp 99.9 F (37.7 C)   Resp 20   Ht 5\' 1"  (1.549 m)   Wt 54.5 kg   LMP 10/05/2019   SpO2 96%   BMI 22.70 kg/m   Intake/Output Summary (Last 24 hours) at 11/08/2019 1429 Last data filed at 11/08/2019 0600 Gross per 24 hour  Intake 720 ml  Output 2450 ml  Net -1730 ml   Weight change: -9.9 kg  Physical Exam: General: alert, feels better-   Mother at bedside  Heart: RRR Lungs: CBS bilat  Abdomen: distended but no rebound  Extremities: mild edema (tr)  Imaging: 01/08/2020 RENAL  Result Date: 11/07/2019 CLINICAL DATA:  Inpatient.  Right flank pain for 1 day. EXAM: RENAL / URINARY TRACT ULTRASOUND COMPLETE COMPARISON:  10/31/2019 renal sonogram. FINDINGS: Right Kidney: Renal measurements: 13.8 x 6.2 x 5.9 cm = volume: 263 mL. Echogenic right renal parenchyma, normal thickness. No hydronephrosis. No renal mass. Left Kidney: Renal measurements: 12.2 x 7.3 x 7.4 cm = volume: 345 mL. Echogenic left renal parenchyma, normal thickness. No hydronephrosis. No renal mass. Bladder: Appears normal for degree of bladder distention. Other: Bilateral  ureteral jets seen in the bladder. IMPRESSION: 1. No hydronephrosis. 2. Echogenic kidneys, unchanged, indicative of nonspecific renal parenchymal disease. 3. Normal bladder. Electronically Signed   By: 12/31/2019 M.D.   On: 11/07/2019 12:34   DG Abd Portable 1V  Result Date: 11/07/2019 CLINICAL DATA:  Flank pain EXAM: PORTABLE ABDOMEN - 1 VIEW COMPARISON:  None. FINDINGS: No dilated small bowel loops. Mild colonic stool and gas. No evidence of pneumatosis or pneumoperitoneum. No radiopaque nephrolithiasis. IMPRESSION: Nonobstructive bowel gas pattern. Electronically Signed   By: 01/07/2020 M.D.   On: 11/07/2019 12:35    Labs: BMET Recent Labs  Lab 11/03/19 0111 11/04/19 0205 11/05/19 0657 11/05/19 1700 11/06/19 0110 11/07/19 0121 11/08/19 0242  NA 134* 136 136 136 136 138 140  K 4.8 5.1 6.0* 5.1 4.2 3.9 4.2  CL 102 103 109 104 100 98 96*  CO2 17* 17* 12* 18* 21* 24 24  GLUCOSE 95 154* 82 96 118* 103* 97  BUN 17 33* 52* 54* 53* 56* 58*  CREATININE 2.60* 5.36* 7.51* 8.19* 8.49* 9.86* 9.52*  CALCIUM 8.5* 8.1* 7.7* 7.6* 7.4* 7.8* 8.2*  PHOS  --  5.8* 5.4* 6.1* 5.7* 8.3* 8.8*   CBC Recent Labs  Lab 11/04/19 0205 11/05/19 0657 11/06/19 0110 11/08/19 0242  WBC 38.4* 27.5* 23.2* 20.5*  HGB 12.6 12.0 10.3* 11.9*  HCT 39.0  37.8 30.4* 36.3  MCV 90.5 93.3 86.9 88.1  PLT 391 284 330 289    Medications:    . amoxicillin-clavulanate  1 tablet Oral Q24H  . chlorpheniramine-HYDROcodone  5 mL Oral Q12H  . feeding supplement (ENSURE ENLIVE)  237 mL Oral BID BM  . furosemide  80 mg Intravenous Q12H  . polyethylene glycol  17 g Oral Daily  . sodium bicarbonate  650 mg Oral BID  . sodium chloride flush  3 mL Intravenous Once      Paulene Floor, MD 11/08/2019, 2:29 PM

## 2019-11-08 NOTE — Progress Notes (Addendum)
NAME:  Ellen Skinner, MRN:  [id number], DOB:  1995/08/25, LOS: 6 ADMISSION DATE:  11/01/2019, CONSULTATION DATE:  11/02/19 REFERRING MD:  Louanne Belton, CHIEF COMPLAINT:  Pleurisy   Brief History   24 year old woman with history of scleroderma, not currently on therapy, admitted 8/2 for worsening right sided chest wall pain.  Started in mid-July, went to local [facility] where she was diagnosed with a symptomatic bullae.  Case was discussed with cardiothoracic surgeon there who was hesitant to offer lobectomy due to issues down the line with potentially needing lung transplant (lungs also showing significant paraseptal emphysema).  She was sent home with script for advil and prednisone taper.  She is visiting here for her brother's commencement ceremony, had worsening chest wall pain and fevers.  CT Chest here showing what appears to be necrotic lung.  She does have a cough but not really able to bring anything up.  No recent sick contacts.  She was on hydroxychloroquine up to the admission in mid-July.  No aspiration symptoms, no GI symptoms.  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  Denies Sicca, joint issues, DOE (until recently).   Significant Hospital Events   8/02 Admit   Consults:     Procedures:     Significant Diagnostic Tests:  CTA Chest 8/3 >> negative for PE, large cavitary process involving majority of the right lower lobe and most of the right middle lobe. There are multiple internal fluid levels within the cavitary process which appears to localize to the lung parenchyma as opposed to the pleural space. Suspect necrotizing pneumonia with probable pulmonary abscess. There is dense consolidation within the right middle and lower lobes. The remaining aerated lung parenchyma is abnormal with presence of emphysematous disease. Right hilar and mediastinal adenopathy is likely reactive  A1At 8/3 >> 162, MM (ie does not  have A1AT def)  Micro Data:  COVID 8/3 >> neg  Antimicrobials:  Vanc 8/3>> 8/4 Zosyn 8/3>> 8/4 Unasyn 8/4 >>   Interim history/subjective:  Feels ok, multiple questions about long term plan  Minimal secretions / cough  Denies fevers, tmax 99.9 Mom at bedside.   Objective   Blood pressure 126/76, pulse (!) 108, temperature 99.9 F (37.7 C), resp. rate 20, height _0  (1.549 m), weight 54.5 kg, last menstrual period 10/05/2019, SpO2 96 %.        Intake/Output Summary (Last 24 hours) at 11/08/2019 1210 Last data filed at 11/08/2019 0600 Gross per 24 hour  Intake 720 ml  Output 2950 ml  Net -2230 ml   Filed Weights   11/07/19 0443 11/07/19 2148 11/08/19 0500  Weight: 59.4 kg 49.5 kg 54.5 kg    Examination: General: thin, young adult female sitting on side of bed in NAD HEENT: MM pink/moist, tight appearing skin on face / some evidence of scleroderma facies  Neuro: AAOx4, speech clear, MAE CV: s1s2 rrr, no m/r/g PULM: non-labored on RA at rest, clear on left, diminished on right  GI: soft, bsx4 active  Extremities: warm/dry, no edema, clubbing of nail beds Skin: no rashes or lesions  Resolved Hospital Problem list   None  Assessment & Plan:   Infected bullae +/- necrotic lung +/- lung abscess Radiographic review shows most of RLL and RML are gone.  Previously immunosuppressed.  No aspiration symptoms.  BAL culture negative -continue augmentin, will need 4 weeks abx with repeat imaging for reivew -if no improvement in bullous disease  burden, will need referral to thoracic surgery for possible bullectomy / resection. Hopeful to avoid this as would be extensive surgery and may affect her potential for transplant if needed in the future  -discussed use of yogurt daily with abx -she would like to follow up at [facility] since she is in school there  -will obtain CT imaging for her to take with her to [place]  -will need outpatient Rheumatology follow up -follow BAL results    Paraseptal emphysema, connective tissue, related ILD -Immunosuppressive regimen, DMARD therapy will have to be delayed until she clears this infection   PCCM will be available PRN.  Please call back if new needs arise.     Labs   CBC: Recent Labs  Lab 11/03/19 0111 11/04/19 0205 11/05/19 0657 11/06/19 0110 11/08/19 0242  WBC 34.2* 38.4* 27.5* 23.2* 20.5*  HGB 10.7* 12.6 12.0 10.3* 11.9*  HCT 33.7* 39.0 37.8 30.4* 36.3  MCV 91.1 90.5 93.3 86.9 88.1  PLT 342 391 284 330 638    Basic Metabolic Panel: Recent Labs  Lab 11/05/19 0657 11/05/19 1700 11/06/19 0110 11/07/19 0121 11/08/19 0242  NA 136 136 136 138 140  K 6.0* 5.1 4.2 3.9 4.2  CL 109 104 100 98 96*  CO2 12* 18* 21* 24 24  GLUCOSE 82 96 118* 103* 97  BUN 52* 54* 53* 56* 58*  CREATININE 7.51* 8.19* 8.49* 9.86* 9.52*  CALCIUM 7.7* 7.6* 7.4* 7.8* 8.2*  PHOS 5.4* 6.1* 5.7* 8.3* 8.8*   GFR: Estimated Creatinine Clearance: 7 mL/min (A) (by C-G formula based on SCr of 9.52 mg/dL (H)). Recent Labs  Lab 11/02/19 0521 11/03/19 0111 11/04/19 0205 11/05/19 0657 11/06/19 0110 11/08/19 0242  WBC  --    < > 38.4* 27.5* 23.2* 20.5*  LATICACIDVEN 1.7  --   --   --   --   --    < > = values in this interval not displayed.    Liver Function Tests: Recent Labs  Lab 11/03/19 0111 11/04/19 0205 11/05/19 0657 11/05/19 1700 11/06/19 0110 11/07/19 0121 11/08/19 0242  AST 23  --   --   --   --   --   --   ALT 26  --   --   --   --   --   --   ALKPHOS 165*  --   --   --   --   --   --   BILITOT 1.6*  --   --   --   --   --   --   PROT 7.7  --   --   --   --   --   --   ALBUMIN 1.9*   < > 1.4* 1.4* 1.4* 1.5* 2.0*   < > = values in this interval not displayed.   No results for input(s): LIPASE, AMYLASE in the last 168 hours. No results for input(s): AMMONIA in the last 168 hours.  ABG No results found for: PHART, PCO2ART, PO2ART, HCO3, TCO2, ACIDBASEDEF, O2SAT   Coagulation Profile: Recent Labs  Lab  11/03/19 0111  INR 1.3*    [name], MSN, NP-C [facility] 11/08/2019, 12:10 PM   Please see [email or web address] for pager details.

## 2019-11-09 ENCOUNTER — Inpatient Hospital Stay (HOSPITAL_COMMUNITY): Payer: BC Managed Care – PPO

## 2019-11-09 DIAGNOSIS — J850 Gangrene and necrosis of lung: Secondary | ICD-10-CM | POA: Diagnosis not present

## 2019-11-09 LAB — RENAL FUNCTION PANEL
Albumin: 2.1 g/dL — ABNORMAL LOW (ref 3.5–5.0)
Anion gap: 19 — ABNORMAL HIGH (ref 5–15)
BUN: 53 mg/dL — ABNORMAL HIGH (ref 6–20)
CO2: 27 mmol/L (ref 22–32)
Calcium: 8.3 mg/dL — ABNORMAL LOW (ref 8.9–10.3)
Chloride: 91 mmol/L — ABNORMAL LOW (ref 98–111)
Creatinine, Ser: 8.18 mg/dL — ABNORMAL HIGH (ref 0.44–1.00)
GFR calc Af Amer: 7 mL/min — ABNORMAL LOW (ref >60)
GFR calc non Af Amer: 6 mL/min — ABNORMAL LOW (ref >60)
Glucose, Bld: 136 mg/dL — ABNORMAL HIGH (ref 70–99)
Phosphorus: 7.5 mg/dL — ABNORMAL HIGH (ref 2.5–4.6)
Potassium: 3.4 mmol/L — ABNORMAL LOW (ref 3.5–5.1)
Sodium: 137 mmol/L (ref 135–145)

## 2019-11-09 MED ORDER — SODIUM CHLORIDE 0.9 % IV SOLN
1.5000 g | INTRAVENOUS | Status: DC
  Administered 2019-11-09: 1.5 g via INTRAVENOUS
  Filled 2019-11-09 (×2): qty 4

## 2019-11-09 MED ORDER — BISACODYL 10 MG RE SUPP
10.0000 mg | Freq: Once | RECTAL | Status: AC
  Administered 2019-11-09: 10 mg via RECTAL
  Filled 2019-11-09: qty 1

## 2019-11-09 NOTE — Progress Notes (Signed)
PROGRESS NOTE    Ellen Skinner  EXB:[id number]  DOB: 04-Oct-1995  DOA: 11/01/2019 PCP: System, Pcp Not In Outpatient Specialists:   Hospital course:  Ellen Skinner 24-year-old female who was diagnosed with scleroderma at age 41 but was lost to follow-up was admitted 11/01/19 with large necrotizing pneumonia.  She underwent bronchoscopy 11/03/2019 with BAL.  She has been treated with Augmentin with improvement of her respiratory status.    She has also developed acute renal failure with initial creatinine of 0.8 increasing to a high of 9.5 in the setting of vancomycin, Zosyn, Toradol and contrast dye.  Subjective:  Patient states she feels uncomfortable in her abdominal area.  Feels very constipated.  Notes she has not passed any gas for 4 days and has not had a bowel movement for 6 days.  Denies any nausea or vomiting.    Patient does admit that she has been having difficulty swallowing solids for the past week or so.  Notes that she is able to swallow liquids okay.   Objective: Vitals:   11/08/19 1615 11/08/19 2315 11/09/19 0749 11/09/19 1553  BP: 123/68 122/73 116/80 122/81  Pulse: 97 (!) 109 95 94  Resp:   16 16  Temp: 98.7 F (37.1 C) 98.4 F (36.9 C) 98.7 F (37.1 C) 98.8 F (37.1 C)  TempSrc:      SpO2: 95% 93% 97% 97%  Weight:      Height:        Intake/Output Summary (Last 24 hours) at 11/09/2019 1806 Last data filed at 11/09/2019 1201 Gross per 24 hour  Intake --  Output 2500 ml  Net -2500 ml   Filed Weights   11/07/19 0443 11/07/19 2148 11/08/19 0500  Weight: 59.4 kg 49.5 kg 54.5 kg     Exam:  General: Thin female with clear scleroderma sitting up in bed with attentive mother at bedside. Eyes: sclera anicteric, conjuctiva mild injection bilaterally CVS: S1-S2, regular  Respiratory:  decreased air entry bilaterally secondary to decreased inspiratory effort, rales at bases  GI: Somewhat distended.  She does have a few bowel sounds but rare.  Abdomen has mild  tenderness to palpation diffusely more in the left lower quadrant.  No rebound tenderness. LE: Clear sclerodactyly bilaterally. Neuro: grossly nonfocal.  Psych: Patient is intermittently tearful and frightened with what is happening with her.   Assessment & Plan:   Necrotizing pneumonia Is doing well on antibiotic day 8 Will change Augmentin to Unasyn given SBO/ileus Appreciate ongoing pulmonary critical care consultation.  Renal failure Thought to be secondary to combination of Vanco/Zosyn/contrast/Toradol as this is developed in house I am wondering if this could be scleroderma renal crisis as well Creatinine seems to be trending back down again, renal is following, appreciate their input. Patient is making good urine  Ileus versus SBO Plain films today show dilated bowel loops with some air-fluid levels per the read Patient is without any nausea or vomiting, will keep her on full liquids. 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 Will get speech therapy involved to see if she can safely take oral contrast for CT scan. General surgery consult in the morning.  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 May benefit from transfer to [facility] either as an inpatient or an outpatient. 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   DVT prophylaxis: SCD Code Status: Full Family Communication: Mother is at bedside continuously Disposition Plan:   Patient is from: Home  Anticipated Discharge Location: Home  Barriers to Discharge: Ileus, necrotizing pneumonia  Is patient medically stable for Discharge: No   Consultants:  Renal  Pulmonary  Procedures:  83 CTA chest -- no PE,  Large  cavitary process involving majority of the right lower lobe and most of the right middle lobe. There are multiple internal fluid levels within the cavitary process which appears to localize to the lung parenchyma as opposed to the pleural space. Suspect necrotizing pneumonia with probable pulmonary abscess. There is dense consolidation within the right middle and lower lobes   8/5 US renal -- medicorenal diseasse, no hydronephrosis   8/4 bronchoscopy Findings: The laryngeal mask airway is in good position. The vocal cords appear  normal. The subglottic space is normal. The trachea is of normal  caliber. The carina is sharp. The tracheobronchial tree was examined to  at least the first subsegmental level. Bronchial mucosa and anatomy are  normal; there are no endobronchial lesions, and no secretions. Bronchoalveolar lavage was performed in the right middle lobe of the  lung and sent for cell count, bacterial culture, and fungal &AFB  analysis. 60 mL of fluid were instilled. 28 mL were returned. The return  was clear. There were no mucoid plugs in the return fluid. Impression: - Right upper lobe pneumonia - Right middle lobe pneumonia - Right lower lobe pneumonia - The airway examination was normal. - Bronchoalveolar lavage was performed in the right  lower lobe to be sent for culture data.  Antimicrobials:  Vancomycin and Zosyn August 2 to August 3  Unasyn August 4 to present   Data Reviewed:  Basic Metabolic Panel: Recent Labs  Lab 11/05/19 1700 11/06/19 0110 11/07/19 0121 11/08/19 0242 11/09/19 0132  NA 136 136 138 140 137  K 5.1 4.2 3.9 4.2 3.4*  CL 104 100 98 96* 91*  CO2 18* 21* _0 GLUCOSE 96 118* 103* 97 136*  BUN 54* 53* 56* 58* 53*  CREATININE 8.19* 8.49* 9.86* 9.52* 8.18*    CALCIUM 7.6* 7.4* 7.8* 8.2* 8.3*  PHOS 6.1* 5.7* 8.3* 8.8* 7.5*   Liver Function Tests: Recent Labs  Lab 11/03/19 0111 11/04/19 0205 11/05/19 1700 11/06/19 0110 11/07/19 0121 11/08/19 0242 11/09/19 0132  AST 23  --   --   --   --   --   --   ALT 26  --   --   --   --   --   --   ALKPHOS 165*  --   --   --   --   --   --   BILITOT 1.6*  --   --   --   --   --   --   PROT 7.7  --   --   --   --   --   --   ALBUMIN 1.9*   < > 1.4* 1.4* 1.5* 2.0* 2.1*   < > = values in this interval not displayed.   No results for input(s): LIPASE, AMYLASE in the last 168 hours. No results for input(s): AMMONIA in the last 168 hours. CBC: Recent Labs  Lab 11/03/19 0111 11/04/19 0205 11/05/19 0657 11/06/19 0110 11/08/19 0242  WBC 34.2* 38.4* 27.5* 23.2*  20.5*  HGB 10.7* 12.6 12.0 10.3* 11.9*  HCT 33.7* 39.0 37.8 30.4* 36.3  MCV 91.1 90.5 93.3 86.9 88.1  PLT 342 391 284 330 289   Cardiac Enzymes: No results for input(s): CKTOTAL, CKMB, CKMBINDEX, TROPONINI in the last 168 hours. BNP (last 3 results) No results for input(s): PROBNP in the last 8760 hours. CBG: No results for input(s): GLUCAP in the last 168 hours.  Recent Results (from the past 240 hour(s))  SARS Coronavirus 2 by RT PCR (hospital order, performed in [facility] hospital lab) Nasopharyngeal Nasopharyngeal Swab     Status: None   Collection Time: 11/02/19  2:48 AM   Specimen: Nasopharyngeal Swab  Result Value Ref Range Status   SARS Coronavirus 2 NEGATIVE NEGATIVE Final    Comment: (NOTE) SARS-CoV-2 target nucleic acids are NOT DETECTED.  The SARS-CoV-2 RNA is generally detectable in upper and lower respiratory specimens during the acute phase of infection. The lowest concentration of SARS-CoV-2 viral copies this assay can detect is 250 copies / mL. A negative result does not preclude SARS-CoV-2 infection and should not be used as the sole basis for treatment or other patient management decisions.  A negative result  may occur with improper specimen collection / handling, submission of specimen other than nasopharyngeal swab, presence of viral mutation(s) within the areas targeted by this assay, and inadequate number of viral copies (<250 copies / mL). A negative result must be combined with clinical observations, patient history, and epidemiological information.  Fact Sheet for Patients:   StrictlyIdeas.no  Fact Sheet for Healthcare Providers: BankingDealers.co.za  This test is not yet approved or  cleared by the [place] FDA and has been authorized for detection and/or diagnosis of SARS-CoV-2 by FDA under an Emergency Use Authorization (EUA).  This EUA will remain in effect (meaning this test can be used) for the duration of the COVID-19 declaration under Section 564(b)(1) of the Act, 21 U.S.C. section 360bbb-3(b)(1), unless the authorization is terminated or revoked sooner.  Performed at [facility], [place] [address], [place], [place] [address]   MRSA PCR Screening     Status: None   Collection Time: 11/02/19  4:05 AM   Specimen: Nasopharyngeal  Result Value Ref Range Status   MRSA by PCR NEGATIVE NEGATIVE Final    Comment:        The GeneXpert MRSA Assay (FDA approved for NASAL specimens only), is one component of a comprehensive MRSA colonization surveillance program. It is not intended to diagnose MRSA infection nor to guide or monitor treatment for MRSA infections. Performed at [facility], [place] [address] [place], [place] [address]   Culture, blood (routine x 2) Call MD if unable to obtain prior to antibiotics being given     Status: None   Collection Time: 11/02/19  4:40 AM   Specimen: BLOOD  Result Value Ref Range Status   Specimen Description BLOOD RIGHT ARM  Final   Special Requests   Final    BOTTLES DRAWN AEROBIC AND ANAEROBIC Blood Culture results may not be optimal due to an inadequate volume of blood  received in culture bottles   Culture   Final    NO GROWTH 5 DAYS Performed at [facility], [place] [address], [place], [place] [address]    Report Status 11/07/2019 FINAL  Final  Culture, sputum-assessment     Status: None   Collection Time: 11/02/19  8:31 AM   Specimen: Expectorated Sputum  Result Value Ref  Range Status   Specimen Description Expect. Sput  Final   Special Requests NONE  Final   Sputum evaluation   Final    THIS SPECIMEN IS ACCEPTABLE FOR SPUTUM CULTURE Performed at [facility], [address] [place], [place] [address]    Report Status 11/02/2019 FINAL  Final  Culture, respiratory     Status: None   Collection Time: 11/02/19  8:31 AM  Result Value Ref Range Status   Specimen Description Expect. Sput  Final   Special Requests NONE Reflexed from [id number]  Final   Gram Stain   Final    ABUNDANT WBC PRESENT,BOTH PMN AND MONONUCLEAR FEW GRAM NEGATIVE RODS MODERATE GRAM POSITIVE COCCI ABUNDANT GRAM VARIABLE ROD    Culture   Final    FEW Normal respiratory flora-no Staph aureus or Pseudomonas seen Performed at [facility], [address] [place], [place] [address]    Report Status 11/04/2019 FINAL  Final  Culture, respiratory     Status: None   Collection Time: 11/03/19 10:51 AM   Specimen: PATH Cytology Bronchial lavage; Respiratory  Result Value Ref Range Status   Specimen Description Bronch Lavag  Final   Special Requests NONE  Final   Gram Stain   Final    RARE WBC PRESENT,BOTH PMN AND MONONUCLEAR NO ORGANISMS SEEN    Culture   Final    NO GROWTH 2 DAYS Performed at [facility], [address] [place], [place] [address]    Report Status 11/05/2019 FINAL  Final  Fungus Culture With Stain     Status: None (Preliminary result)   Collection Time: 11/03/19 10:58 AM   Specimen: PATH Cytology Bronchial lavage  Result Value Ref Range Status   Fungus Stain Final report  Final    Comment: (NOTE) Performed At: [facility] [place], [place] [id number] Rush Farmer MD FG:[id number]    Fungus (Mycology) Culture PENDING  Incomplete   Fungal Source BRONCHIAL ALVEOLAR LAVAGE  Final    Comment: Performed at [facility], [place] [address] [place], [place] [address]  Fungus Culture Result     Status: None   Collection Time: 11/03/19 10:58 AM  Result Value Ref Range Status   Result 1 Comment  Final    Comment: (NOTE) KOH/Calcofluor preparation:  no fungus observed. Performed At: [facility] [place], [place] [id number] Rush Farmer MD HE:[id number]       Studies: DG Abd 2 Views  Result Date: 11/09/2019 CLINICAL DATA:  Lower abdominal pain EXAM: ABDOMEN - 2 VIEW COMPARISON:  November 07, 2019 FINDINGS: Supine and upright images were obtained. There are loops of borderline dilated small bowel with air-fluid levels. No free air. There is a right pleural effusion with atelectatic change in the right mid and lower lung regions. No abnormal calcifications. IMPRESSION: Loops of dilated bowel with air-fluid levels. Question ileus versus a degree of bowel obstruction. No free air. Right pleural effusion with right lower lung region atelectatic change. Electronically Signed   By: Lowella Grip III M.D.   On: 11/09/2019 12:36     Scheduled Meds:  amoxicillin-clavulanate  1 tablet Oral Q24H   chlorpheniramine-HYDROcodone  5 mL Oral Q12H   feeding supplement (ENSURE ENLIVE)  237 mL Oral BID BM   nystatin  5 mL Oral QID   polyethylene glycol  17 g Oral Daily   sodium chloride flush  3 mL Intravenous Once   Continuous Infusions:  Principal Problem:   Necrotizing pneumonia ([place]) Active Problems:   Paraseptal emphysema ([place])   Scleroderma involving lung ([place])   Pulmonary abscess ([place])   Sepsis due to pneumonia ([place])     Blessing Zaucha Derek Jack, [facility]  If 7PM-7AM, please contact night-coverage [email or web address] Password [email or web address] 11/09/2019,  6:06 PM    LOS: 7 days

## 2019-11-09 NOTE — Progress Notes (Signed)
  [facility] Progress Note   24 y.o.yo femalewith scleroderma and emphysema who was admitted on8/2/2021with PNA- Has suffered AKI   Assessment/ Plan:   1. Renal- AKI with baseline crt less than 1. Time course c/w contrast induced nephropathy- Urine with 100 of protein and no RBCs. Numbers worsened again   Renal function improving for 2nd day in a row.  Good UOP.  Encouraged her to drink fluids if thirsty and ambulate.  Will sign off at this time; please reconsult as needed. 2. Hyperkalemia- Better with conservative measures; resolved 3. Anemia-not a majorissue 4. Metabolic acidosis- Due to AKI- Stop oral bicarb 5. HTN/volume-BP is perfect - no meds. With the IVF is feeling bloated- stoppedIVF and on lasix. Would hold the Lasix also and see where she's at. 6. PNA-unasyn- then augmentin- per primary team- WBC decreasing   Subjective:   Constipation, no BM since last Thur   Objective:   BP 116/80 (BP Location: Left Arm)   Pulse 95   Temp 98.7 F (37.1 C)   Resp 16   Ht 5\' 1"  (1.549 m)   Wt 54.5 kg   LMP 10/05/2019   SpO2 97%   BMI 22.70 kg/m   Intake/Output Summary (Last 24 hours) at 11/09/2019 1129 Last data filed at 11/09/2019 0315 Gross per 24 hour  Intake --  Output 1600 ml  Net -1600 ml   Weight change:   Physical Exam: General: alert,feels better- Mother at bedside  Heart: RRR Lungs: CBS bilat  Abdomen: distended but no rebound Extremities: mild edema (tr)  Imaging: No results found.  Labs: BMET Recent Labs  Lab 11/04/19 0205 11/05/19 0657 11/05/19 1700 11/06/19 0110 11/07/19 0121 11/08/19 0242 11/09/19 0132  NA 136 136 136 136 138 140 137  K 5.1 6.0* 5.1 4.2 3.9 4.2 3.4*  CL 103 109 104 100 98 96* 91*  CO2 17* 12* 18* 21* 24 24 27   GLUCOSE 154* 82 96 118* 103* 97 136*  BUN 33* 52* 54* 53* 56* 58* 53*  CREATININE 5.36* 7.51* 8.19* 8.49* 9.86* 9.52* 8.18*  CALCIUM 8.1* 7.7* 7.6* 7.4* 7.8* 8.2* 8.3*   PHOS 5.8* 5.4* 6.1* 5.7* 8.3* 8.8* 7.5*   CBC Recent Labs  Lab 11/04/19 0205 11/05/19 0657 11/06/19 0110 11/08/19 0242  WBC 38.4* 27.5* 23.2* 20.5*  HGB 12.6 12.0 10.3* 11.9*  HCT 39.0 37.8 30.4* 36.3  MCV 90.5 93.3 86.9 88.1  PLT 391 284 330 289    Medications:    . amoxicillin-clavulanate  1 tablet Oral Q24H  . chlorpheniramine-HYDROcodone  5 mL Oral Q12H  . feeding supplement (ENSURE ENLIVE)  237 mL Oral BID BM  . furosemide  80 mg Intravenous Q12H  . nystatin  5 mL Oral QID  . polyethylene glycol  17 g Oral Daily  . sodium bicarbonate  650 mg Oral BID  . sodium chloride flush  3 mL Intravenous Once      01/06/20, MD 11/09/2019, 11:29 AM

## 2019-11-09 NOTE — Progress Notes (Signed)
Pharmacy Antibiotic Note  Ellen Skinner is a 24 y.o. female admitted on 11/01/2019 with necrotizing PNA / pulm abscess.  Today is abx day #7.  Admission has been complicated by AKI with SCr 8, up from baseline of 0.8, however improved from peak of 9.8.  Today patient endorsees abd pain / bloating.  Pharmacy asked to switch abx to Unasyn given concern for SBO / ileus.   Plan: Unasyn 1.5gm IV q24h  Height: _0  (154.9 cm) Weight: 54.5 kg (120 lb 2.4 oz) IBW/kg (Calculated) : 47.8  Temp (24hrs), Avg:98.6 F (37 C), Min:98.4 F (36.9 C), Max:98.8 F (37.1 C)  Recent Labs  Lab 11/03/19 0111 11/03/19 0111 11/04/19 0205 11/04/19 0205 11/05/19 0657 11/05/19 0657 11/05/19 1700 11/06/19 0110 11/07/19 0121 11/08/19 0242 11/09/19 0132  WBC 34.2*  --  38.4*  --  27.5*  --   --  23.2*  --  20.5*  --   CREATININE 2.60*   < > 5.36*   < > 7.51*   < > 8.19* 8.49* 9.86* 9.52* 8.18*   < > = values in this interval not displayed.    Estimated Creatinine Clearance: 8.1 mL/min (A) (by C-G formula based on SCr of 8.18 mg/dL (H)).    No Known Allergies  Antimicrobials this admission: Augmentin 8/7 >> 8/10 Unasyn 8/4 >> 8/7, restart 8/10  Dose adjustments this admission:   Microbiology results: 8/3 Sputum - GNR / GPC / GVR on Gram stain >> normal flora 8/3 BCx - neg 8/4 BAL - neg 8/4 fungus-- no fungus observed  Thank you for allowing pharmacy to be a part of this patient's care.  [employer], Pharm.D., BCPS Clinical Pharmacist  **Pharmacist phone directory can be found on [email or web address] listed under [place].  11/09/2019 6:46 PM

## 2019-11-10 ENCOUNTER — Inpatient Hospital Stay (HOSPITAL_COMMUNITY): Payer: BC Managed Care – PPO

## 2019-11-10 DIAGNOSIS — M349 Systemic sclerosis, unspecified: Secondary | ICD-10-CM | POA: Diagnosis not present

## 2019-11-10 DIAGNOSIS — E440 Moderate protein-calorie malnutrition: Secondary | ICD-10-CM | POA: Diagnosis not present

## 2019-11-10 DIAGNOSIS — E876 Hypokalemia: Secondary | ICD-10-CM

## 2019-11-10 DIAGNOSIS — J851 Abscess of lung with pneumonia: Secondary | ICD-10-CM | POA: Diagnosis not present

## 2019-11-10 DIAGNOSIS — J850 Gangrene and necrosis of lung: Secondary | ICD-10-CM | POA: Diagnosis not present

## 2019-11-10 DIAGNOSIS — R7989 Other specified abnormal findings of blood chemistry: Secondary | ICD-10-CM

## 2019-11-10 LAB — CBC WITH DIFFERENTIAL/PLATELET
Abs Immature Granulocytes: 0.16 10*3/uL — ABNORMAL HIGH (ref 0.00–0.07)
Basophils Absolute: 0.1 10*3/uL (ref 0.0–0.1)
Basophils Relative: 0 %
Eosinophils Absolute: 0.7 10*3/uL — ABNORMAL HIGH (ref 0.0–0.5)
Eosinophils Relative: 3 %
HCT: 32.9 % — ABNORMAL LOW (ref 36.0–46.0)
Hemoglobin: 10.6 g/dL — ABNORMAL LOW (ref 12.0–15.0)
Immature Granulocytes: 1 %
Lymphocytes Relative: 10 %
Lymphs Abs: 2 10*3/uL (ref 0.7–4.0)
MCH: 28.3 pg (ref 26.0–34.0)
MCHC: 32.2 g/dL (ref 30.0–36.0)
MCV: 87.7 fL (ref 80.0–100.0)
Monocytes Absolute: 1.1 10*3/uL — ABNORMAL HIGH (ref 0.1–1.0)
Monocytes Relative: 5 %
Neutro Abs: 15.5 10*3/uL — ABNORMAL HIGH (ref 1.7–7.7)
Neutrophils Relative %: 81 %
Platelets: 392 10*3/uL (ref 150–400)
RBC: 3.75 MIL/uL — ABNORMAL LOW (ref 3.87–5.11)
RDW: 13.4 % (ref 11.5–15.5)
WBC: 18.9 10*3/uL — ABNORMAL HIGH (ref 4.0–10.5)
nRBC: 0 % (ref 0.0–0.2)

## 2019-11-10 LAB — RENAL FUNCTION PANEL
Albumin: 2.1 g/dL — ABNORMAL LOW (ref 3.5–5.0)
Anion gap: 15 (ref 5–15)
BUN: 49 mg/dL — ABNORMAL HIGH (ref 6–20)
CO2: 29 mmol/L (ref 22–32)
Calcium: 8.4 mg/dL — ABNORMAL LOW (ref 8.9–10.3)
Chloride: 92 mmol/L — ABNORMAL LOW (ref 98–111)
Creatinine, Ser: 6.69 mg/dL — ABNORMAL HIGH (ref 0.44–1.00)
GFR calc Af Amer: 9 mL/min — ABNORMAL LOW (ref >60)
GFR calc non Af Amer: 8 mL/min — ABNORMAL LOW (ref >60)
Glucose, Bld: 102 mg/dL — ABNORMAL HIGH (ref 70–99)
Phosphorus: 6.5 mg/dL — ABNORMAL HIGH (ref 2.5–4.6)
Potassium: 3.4 mmol/L — ABNORMAL LOW (ref 3.5–5.1)
Sodium: 136 mmol/L (ref 135–145)

## 2019-11-10 MED ORDER — AMOXICILLIN-POT CLAVULANATE 500-125 MG PO TABS
1.0000 | ORAL_TABLET | Freq: Every day | ORAL | Status: DC
  Administered 2019-11-11 – 2019-11-12 (×2): 500 mg via ORAL
  Filled 2019-11-10 (×2): qty 1

## 2019-11-10 MED ORDER — POTASSIUM CHLORIDE 20 MEQ PO PACK
40.0000 meq | PACK | Freq: Once | ORAL | Status: AC
  Administered 2019-11-10: 40 meq via ORAL
  Filled 2019-11-10: qty 2

## 2019-11-10 MED ORDER — AMOXICILLIN-POT CLAVULANATE 875-125 MG PO TABS: 1.0000 | ORAL_TABLET | Freq: Two times a day (BID) | ORAL | Status: DC

## 2019-11-10 MED ORDER — POLYETHYLENE GLYCOL 3350 17 G PO PACK: 17.0000 g | PACK | Freq: Two times a day (BID) | ORAL | Status: DC | PRN

## 2019-11-10 NOTE — Progress Notes (Signed)
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  Dx updated  GOAL:   Patient will meet greater than or equal to 90% of their needs  Progressing  MONITOR:   PO intake, Supplement acceptance, Labs, Weight trends, I & O's  REASON FOR ASSESSMENT:   Malnutrition Screening Tool    ASSESSMENT:   Pt admitted with necrotizing pneumonia and developed ARF shortly after admission. PMH includes scleroderma and paraseptal emphysema  8/4 bronchoscopy  Pt reports poor appetite this week due to feeling constipated/bloated, but states this is improved today after she had a bowel movement last night. Pt had another bowel movement prior to RD visit today. Pt states she woke up hungry this morning. PTA, pt ate 3 balanced meals per day with snacks between meals. Pt loves Ensure and would like to continue receiving these (ordered BID), but is requesting magic cup be discontinued. Pt agreeable to snacks between meals. Pt denies wt loss PTA.   Pt stated to MD yesterday that she has been having difficulty swallowing solids over the last week or so, but stated that liquids are okay. SLP saw pt today and is recommending a regular diet. SLP also recommending GI evaluation and esophageal assessment.   No wt hx available for review; however, pt does report an 8lb wt loss prior to admission, which she attributes to emphysema. Pt unsure of exact time frame of weight loss.   PO Intake: 25-100% x 5 recorded meals since admit (60% average meal intake)  UOP: x24 hours I/O: -4,293.102ml since  admit  Labs: K+ 3.4 (L), Phosphorus 6.5 (H) Medications reviewed.  NUTRITION - FOCUSED PHYSICAL EXAM:    Most Recent Value  Orbital Region Moderate depletion  Upper Arm Region Mild depletion  Thoracic and Lumbar Region Mild depletion  Buccal Region Mild depletion  Temple Region Moderate depletion  Clavicle Bone Region Mild depletion  Clavicle and Acromion Bone Region Mild depletion  Scapular Bone Region Mild depletion  Dorsal Hand Mild depletion  Patellar Region Mild depletion  Anterior Thigh Region Mild depletion  Posterior Calf Region Mild depletion  Edema (RD Assessment) None  Hair Reviewed  Eyes Reviewed  Mouth Reviewed  Skin Reviewed  Nails Reviewed       Diet Order:   Diet Order            Diet regular Room service appropriate? Yes with Assist; Fluid consistency: Thin  Diet effective now                 EDUCATION NEEDS:   No education needs have been identified at this time  Skin:  Skin Assessment: Reviewed RN Assessment  Last BM:  8/11  Height:   Ht Readings from Last 1 Encounters:  11/01/19 5\' 1"  (1.549 m)    Weight:   Wt Readings from Last 1 Encounters:  11/08/19 54.5 kg    BMI:  Body mass index is 22.7 kg/m.  Estimated Nutritional Needs:   Kcal:  1650-1850  Protein:  80-90 grams  Fluid:  >/= 1.65L/d    01/08/20, MS, RD, LDN RD pager number and  weekend/on-call pager number located in [place].

## 2019-11-10 NOTE — Progress Notes (Signed)
SLP Note  Patient Details Name: Ellen Skinner MRN: [id number] DOB: 1995/07/04   SLP met with patient regarding dysphagia/swallow function. 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 Study ordered, to occur at 1330 this date.  Kynzleigh Bandel P. Etheline Geppert, M.S., [place] Pathologist Acute Rehabilitation Services Pager: [place] 11/10/2019, 8:52 AM

## 2019-11-10 NOTE — Progress Notes (Signed)
PROGRESS NOTE  Ellen Skinner VOJ:[id number] DOB: 26-Jun-1995   PCP: System, Pcp Not In  Patient is from: Home  DOA: 11/01/2019 LOS: 8  Brief Narrative / Interim history: 24 year old female who was diagnosed with scleroderma at age 82 but was lost to follow-up was admitted 11/01/19 with large necrotizing pneumonia.  She underwent bronchoscopy 11/03/2019 with BAL.  COVID-19 PCR, respiratory culture including fungal culture and blood cultures negative. She has been treated with Augmentin with improvement of her respiratory status.    Patient developed acute renal failure with creatinine rising from 0.8-9.5.  AKI thought to be iatrogenic from vancomycin, Zosyn, Toradol and contrast versus scleroderma renal crisis. Nephrology consulted.  Renal ultrasound without hydronephrosis but echogenic kidneys of nonspecific renal parenchymal disease.  Eventually renal function improved.  Nephrology signed off.  She also developed ileus that raised concern for possible SBO. However, she started having bowel movements with MiraLAX.  Subjective: Seen and examined this afternoon.  Patient had multiple bowel movements last night and this morning.  No complaints.  Abdominal pain resolved.  Denies chest pain, dyspnea, nausea and vomiting.  She passed swallowing evaluation by SLP.  Patient's mother at bedside.   Objective: Vitals:   11/09/19 0749 11/09/19 1553 11/09/19 2231 11/10/19 0743  BP: 116/80 122/81 114/82 110/74  Pulse: 95 94 92 93  Resp: _0 Temp: 98.7 F (37.1 C) 98.8 F (37.1 C) 98 F (36.7 C) 98.5 F (36.9 C)  TempSrc:      SpO2: 97% 97% 94% 98%  Weight:      Height:        Intake/Output Summary (Last 24 hours) at 11/10/2019 1528 Last data filed at 11/10/2019 0600 Gross per 24 hour  Intake 240 ml  Output 1000 ml  Net -760 ml   Filed Weights   11/07/19 0443 11/07/19 2148 11/08/19 0500  Weight: 59.4 kg 49.5 kg 54.5 kg    Examination:  GENERAL: No apparent distress.   Nontoxic. HEENT: MMM.  Vision and hearing grossly intact.  NECK: Supple.  No apparent JVD.  RESP: On RA.  No IWOB.  Fair aeration bilaterally. CVS:  RRR. Heart sounds normal.  ABD/GI/GU: BS+. Abd soft, NTND.  MSK/EXT:  Moves extremities.  Sclerodactyly SKIN: no apparent skin lesion or wound.  NEURO: Awake, alert and oriented appropriately.  No apparent focal neuro deficit. PSYCH: Calm. Normal affect.   Procedures:  None  Microbiology summarized: COVID-19 PCR negative Respiratory culture with few normal respiratory flora Blood cultures negative Fungal culture negative  Assessment & Plan: Necrotizing pneumonia-improved. -Vancomycin, ceftriaxone and Zosyn 8/3-8/4> IV Unasyn> Augmentin -Continue mucolytic/antitussive -Appreciate PCCM input  Acute renal failure/azotemia-thought to be iatrogenic from antibiotics, NSAID and IV contrast. Cr 0.8 (admit)>> 9.5 (peak)>>> 6.7.  BUN 49 (improved. -Avoid nephrotoxic meds -Continue monitoring -Appreciate input by nephrology-now signed off  Ileus versus SBO-seems to have resolved.  Had multiple bowel movements.  No nausea or vomiting. -Change MiraLAX to as needed -Avoid opiates -Mobilize patient  Dysphagia-cleared by SLP. -On regular diet -May need GI evaluation outpatient  Scleroderma -Need followed up by rheumatologist and GI  Hypokalemia -Replenish and recheck  Hyperphosphatemia: Likely due to renal failure -Continue monitoring   Body mass index is 22.7 kg/m. Nutrition Problem: Predicted suboptimal nutrient intake Etiology: decreased appetite Signs/Symptoms: other (comment) (per MST report) Interventions: Ensure Enlive (each supplement provides 350kcal and 20 grams of protein), Magic cup   DVT prophylaxis:  Place and maintain sequential compression device Start: 11/05/19 1835  Code  Status: Full code Family Communication: Updated patient's mother at bedside Status is: Inpatient  Remains inpatient appropriate  because:Persistent severe electrolyte disturbances and Inpatient level of care appropriate due to severity of illness   Dispo:  Patient From: Home  Planned Disposition: Home with Health Care Svc  Expected discharge date: 11/12/2019  Medically stable for discharge: No        Consultants:  PCCM Nephrology   Sch Meds:  Scheduled Meds: . chlorpheniramine-HYDROcodone  5 mL Oral Q12H  . feeding supplement (ENSURE ENLIVE)  237 mL Oral BID BM  . nystatin  5 mL Oral QID  . sodium chloride flush  3 mL Intravenous Once   Continuous Infusions: . ampicillin-sulbactam (UNASYN) IV 1.5 g (11/09/19 2250)   PRN Meds:.acetaminophen, albuterol, menthol-cetylpyridinium, ondansetron (ZOFRAN) IV, polyethylene glycol  Antimicrobials: Anti-infectives (From admission, onward)   Start     Dose/Rate Route Frequency Ordered Stop   11/09/19 2200  ampicillin-sulbactam (UNASYN) 1.5 g in sodium chloride 0.9 % 100 mL IVPB     Discontinue     1.5 g 200 mL/hr over 30 Minutes Intravenous Every 24 hours 11/09/19 1847     11/07/19 1800  amoxicillin-clavulanate (AUGMENTIN) 500-125 MG per tablet 500 mg  Status:  Discontinued        1 tablet Oral Every 24 hours 11/07/19 1358 11/09/19 1819   11/06/19 1530  amoxicillin-clavulanate (AUGMENTIN) 875-125 MG per tablet 1 tablet  Status:  Discontinued        1 tablet Oral Every 24 hours 11/06/19 1503 11/07/19 1358   11/05/19 0154  ampicillin-sulbactam (UNASYN) 1.5 g in sodium chloride 0.9 % 100 mL IVPB  Status:  Discontinued        1.5 g 200 mL/hr over 30 Minutes Intravenous Every 24 hours 11/04/19 0910 11/06/19 1503   11/03/19 1400  ampicillin-sulbactam (UNASYN) 1.5 g in sodium chloride 0.9 % 100 mL IVPB  Status:  Discontinued        1.5 g 200 mL/hr over 30 Minutes Intravenous Every 12 hours 11/03/19 0739 11/04/19 0910   11/02/19 1000  vancomycin (VANCOCIN) IVPB 1000 mg/200 mL premix  Status:  Discontinued        1,000 mg 200 mL/hr over 60 Minutes Intravenous Every  12 hours 11/02/19 0359 11/03/19 0726   11/02/19 0600  piperacillin-tazobactam (ZOSYN) IVPB 3.375 g  Status:  Discontinued        3.375 g 12.5 mL/hr over 240 Minutes Intravenous Every 8 hours 11/02/19 0359 11/03/19 0726   11/02/19 0145  vancomycin (VANCOCIN) IVPB 1000 mg/200 mL premix        1,000 mg 200 mL/hr over 60 Minutes Intravenous  Once 11/02/19 0138 11/02/19 0340   11/02/19 0145  cefTRIAXone (ROCEPHIN) 2 g in sodium chloride 0.9 % 100 mL IVPB        2 g 200 mL/hr over 30 Minutes Intravenous  Once 11/02/19 0138 11/02/19 0233       I have personally reviewed the following labs and images: CBC: Recent Labs  Lab 11/04/19 0205 11/05/19 0657 11/06/19 0110 11/08/19 0242 11/10/19 0330  WBC 38.4* 27.5* 23.2* 20.5* 18.9*  NEUTROABS  --   --   --   --  15.5*  HGB 12.6 12.0 10.3* 11.9* 10.6*  HCT 39.0 37.8 30.4* 36.3 32.9*  MCV 90.5 93.3 86.9 88.1 87.7  PLT 391 284 330 289 392   BMP &GFR Recent Labs  Lab 11/06/19 0110 11/07/19 0121 11/08/19 0242 11/09/19 0132 11/10/19 0330  NA 136 138 140 137  136  K 4.2 3.9 4.2 3.4* 3.4*  CL 100 98 96* 91* 92*  CO2 21* _0 GLUCOSE 118* 103* 97 136* 102*  BUN 53* 56* 58* 53* 49*  CREATININE 8.49* 9.86* 9.52* 8.18* 6.69*  CALCIUM 7.4* 7.8* 8.2* 8.3* 8.4*  PHOS 5.7* 8.3* 8.8* 7.5* 6.5*   Estimated Creatinine Clearance: 10 mL/min (A) (by C-G formula based on SCr of 6.69 mg/dL (H)). Liver & Pancreas: Recent Labs  Lab 11/06/19 0110 11/07/19 0121 11/08/19 0242 11/09/19 0132 11/10/19 0330  ALBUMIN 1.4* 1.5* 2.0* 2.1* 2.1*   No results for input(s): LIPASE, AMYLASE in the last 168 hours. No results for input(s): AMMONIA in the last 168 hours. Diabetic: No results for input(s): HGBA1C in the last 72 hours. No results for input(s): GLUCAP in the last 168 hours. Cardiac Enzymes: No results for input(s): CKTOTAL, CKMB, CKMBINDEX, TROPONINI in the last 168 hours. No results for input(s): PROBNP in the last 8760  hours. Coagulation Profile: No results for input(s): INR, PROTIME in the last 168 hours. Thyroid Function Tests: No results for input(s): TSH, T4TOTAL, FREET4, T3FREE, THYROIDAB in the last 72 hours. Lipid Profile: No results for input(s): CHOL, HDL, LDLCALC, TRIG, CHOLHDL, LDLDIRECT in the last 72 hours. Anemia Panel: No results for input(s): VITAMINB12, FOLATE, FERRITIN, TIBC, IRON, RETICCTPCT in the last 72 hours. Urine analysis:    Component Value Date/Time   COLORURINE AMBER (A) 11/03/2019 1100   APPEARANCEUR CLOUDY (A) 11/03/2019 1100   LABSPEC 1.018 11/03/2019 1100   PHURINE 5.0 11/03/2019 1100   GLUCOSEU NEGATIVE 11/03/2019 1100   HGBUR NEGATIVE 11/03/2019 1100   BILIRUBINUR NEGATIVE 11/03/2019 1100   KETONESUR NEGATIVE 11/03/2019 1100   PROTEINUR 100 (A) 11/03/2019 1100   NITRITE NEGATIVE 11/03/2019 1100   LEUKOCYTESUR NEGATIVE 11/03/2019 1100   Sepsis Labs: Invalid input(s): PROCALCITONIN, [place]  Microbiology: Recent Results (from the past 240 hour(s))  SARS Coronavirus 2 by RT PCR (hospital order, performed in [facility] hospital lab) Nasopharyngeal Nasopharyngeal Swab     Status: None   Collection Time: 11/02/19  2:48 AM   Specimen: Nasopharyngeal Swab  Result Value Ref Range Status   SARS Coronavirus 2 NEGATIVE NEGATIVE Final    Comment: (NOTE) SARS-CoV-2 target nucleic acids are NOT DETECTED.  The SARS-CoV-2 RNA is generally detectable in upper and lower respiratory specimens during the acute phase of infection. The lowest concentration of SARS-CoV-2 viral copies this assay can detect is 250 copies / mL. A negative result does not preclude SARS-CoV-2 infection and should not be used as the sole basis for treatment or other patient management decisions.  A negative result may occur with improper specimen collection / handling, submission of specimen other than nasopharyngeal swab, presence of viral mutation(s) within the areas targeted by this assay,  and inadequate number of viral copies (<250 copies / mL). A negative result must be combined with clinical observations, patient history, and epidemiological information.  Fact Sheet for Patients:   StrictlyIdeas.no  Fact Sheet for Healthcare Providers: BankingDealers.co.za  This test is not yet approved or  cleared by the [place] FDA and has been authorized for detection and/or diagnosis of SARS-CoV-2 by FDA under an Emergency Use Authorization (EUA).  This EUA will remain in effect (meaning this test can be used) for the duration of the COVID-19 declaration under Section 564(b)(1) of the Act, 21 U.S.C. section 360bbb-3(b)(1), unless the authorization is terminated or revoked sooner.  Performed at [facility], [place]  [address], [place], [place] [address]   MRSA PCR Screening     Status: None   Collection Time: 11/02/19  4:05 AM   Specimen: Nasopharyngeal  Result Value Ref Range Status   MRSA by PCR NEGATIVE NEGATIVE Final    Comment:        The GeneXpert MRSA Assay (FDA approved for NASAL specimens only), is one component of a comprehensive MRSA colonization surveillance program. It is not intended to diagnose MRSA infection nor to guide or monitor treatment for MRSA infections. Performed at [facility], [place] [address], [place], [place] [address]   Culture, blood (routine x 2) Call MD if unable to obtain prior to antibiotics being given     Status: None   Collection Time: 11/02/19  4:40 AM   Specimen: BLOOD  Result Value Ref Range Status   Specimen Description BLOOD RIGHT ARM  Final   Special Requests   Final    BOTTLES DRAWN AEROBIC AND ANAEROBIC Blood Culture results may not be optimal due to an inadequate volume of blood received in culture bottles   Culture   Final    NO GROWTH 5 DAYS Performed at [facility], [place] [address], [place], [place] [address]    Report Status 11/07/2019 FINAL  Final   Culture, sputum-assessment     Status: None   Collection Time: 11/02/19  8:31 AM   Specimen: Expectorated Sputum  Result Value Ref Range Status   Specimen Description Expect. Sput  Final   Special Requests NONE  Final   Sputum evaluation   Final    THIS SPECIMEN IS ACCEPTABLE FOR SPUTUM CULTURE Performed at [facility], [address], [place], [place] [address]    Report Status 11/02/2019 FINAL  Final  Culture, respiratory     Status: None   Collection Time: 11/02/19  8:31 AM  Result Value Ref Range Status   Specimen Description Expect. Sput  Final   Special Requests NONE Reflexed from [id number]  Final   Gram Stain   Final    ABUNDANT WBC PRESENT,BOTH PMN AND MONONUCLEAR FEW GRAM NEGATIVE RODS MODERATE GRAM POSITIVE COCCI ABUNDANT GRAM VARIABLE ROD    Culture   Final    FEW Normal respiratory flora-no Staph aureus or Pseudomonas seen Performed at [facility], [address], [place], [place] [address]    Report Status 11/04/2019 FINAL  Final  Culture, respiratory     Status: None   Collection Time: 11/03/19 10:51 AM   Specimen: PATH Cytology Bronchial lavage; Respiratory  Result Value Ref Range Status   Specimen Description Bronch Lavag  Final   Special Requests NONE  Final   Gram Stain   Final    RARE WBC PRESENT,BOTH PMN AND MONONUCLEAR NO ORGANISMS SEEN    Culture   Final    NO GROWTH 2 DAYS Performed at [facility], [address], [place], [place] [address]    Report Status 11/05/2019 FINAL  Final  Fungus Culture With Stain     Status: None (Preliminary result)   Collection Time: 11/03/19 10:58 AM   Specimen: PATH Cytology Bronchial lavage  Result Value Ref Range Status   Fungus Stain Final report  Final    Comment: (NOTE) Performed At: [facility] [place], [place] [id number] Rush Farmer MD [id number]    Fungus (Mycology) Culture PENDING  Incomplete   Fungal Source BRONCHIAL ALVEOLAR LAVAGE  Final     Comment: Performed at  [facility], [place] [address] [place], [place] [address]  Fungus Culture Result     Status: None   Collection Time: 11/03/19 10:58 AM  Result Value Ref Range Status   Result 1 Comment  Final    Comment: (NOTE) KOH/Calcofluor preparation:  no fungus observed. Performed At: [facility] [place], [place] [id number] Rush Farmer MD [id number]     Radiology Studies: DG Swallowing Func-Speech Pathology  Result Date: 11/10/2019 Objective Swallowing Evaluation: Type of Study: MBS-Modified Barium Swallow Study  Patient Details Name: Raejean Swinford MRN: [id number] Date of Birth: 02/05/1996 Today's Date: 11/10/2019 Time: SLP Start Time (ACUTE ONLY): 1330 -SLP Stop Time (ACUTE ONLY): 1410 SLP Time Calculation (min) (ACUTE ONLY): 40 min Past Medical History: Past Medical History: Diagnosis Date . COPD (chronic obstructive pulmonary disease) (HCC)   scleraderma, emphysema . Pneumonia  . Scleroderma involving lung [facility])  Past Surgical History: Past Surgical History: Procedure Laterality Date . BRONCHIAL WASHINGS  11/03/2019  Procedure: BRONCHIAL WASHINGS;  Surgeon: Collene Gobble, MD;  Location: [facility] ENDOSCOPY;  Service: Cardiopulmonary;; . VIDEO BRONCHOSCOPY Right 11/03/2019  Procedure: VIDEO BRONCHOSCOPY WITHOUT FLUORO;  Surgeon: Collene Gobble, MD;  Location: [place];  Service: Cardiopulmonary;  Laterality: Right; HPI: 20-year-old female who was diagnosed with scleroderma at age 24 but was lost to follow-up was admitted 11/01/19 with large necrotizing pneumonia (R cavitary abscess).  She underwent bronchoscopy 11/03/2019 with BAL.  She has been treated with Augmentin with improvement of her respiratory status. C/o dysphagia to solids.  Subjective: Pt agreeable to MBSS. Assessment / Plan / Recommendation CHL IP CLINICAL IMPRESSIONS 11/10/2019 Clinical Impression Ms. Dhawan demonstrates oropharyngeal swallow function WNL without penetration, aspiration, or residue. She  had noted esophageal stasis with solids, requiring liquid wash to clear.  Stasis/paresis is common to the diagnosis of scleroderma. Suspect this is leading to her other symptoms. She has no difficulty pharyngeally with solids, but reports after she chews, she just "has trouble sending it back there." Given no A/P transit difficulty with liquids or purees, suspect this may be related to some level of hesitancy subconsciously about solids giving her trouble lower in her GI tract. With solids on this exam, pt had mildly prolonged mastication, but functional A/P transit and clearance. Esophageal clearance required liquid wash (see image below of esophagus, taken 60 seconds after the swallow). Recommend: regular/unrestricted solids (pt to order what she is comfortable with), thin liquids, alternate solids/liquids for clearance, sit upright at 90 degrees during and 30 mins after PO intake, follow up with GI for further evaluation/treatment of esophageal deficits. SLP Visit Diagnosis Dysphagia, unspecified (R13.10)  Impact on safety and function Mild aspiration risk   CHL IP TREATMENT RECOMMENDATION 11/10/2019 Treatment Recommendations Therapy as outlined in treatment plan below   Prognosis 11/10/2019 Prognosis for Safe Diet Advancement Good Barriers to Reach Goals -- Barriers/Prognosis Comment -- CHL IP DIET RECOMMENDATION 11/10/2019 SLP Diet Recommendations Regular solids;Thin liquid Liquid Administration via Cup;Straw Medication Administration Whole meds with puree Compensations Slow rate;Small sips/bites;Follow solids with liquid Postural Changes Seated upright at 90 degrees;Remain semi-upright after after feeds/meals (Comment)   CHL IP OTHER RECOMMENDATIONS 11/10/2019 Recommended Consults Consider GI evaluation;Consider esophageal assessment Oral Care Recommendations Oral care BID Other Recommendations --   CHL IP FOLLOW UP RECOMMENDATIONS 11/10/2019 Follow up Recommendations None   CHL IP FREQUENCY AND DURATION 11/10/2019  Speech Therapy Frequency (ACUTE ONLY) min 2x/week Treatment Duration 1 week      CHL IP ORAL PHASE 11/10/2019 Oral Phase  WFL  CHL IP PHARYNGEAL PHASE 11/10/2019 Pharyngeal Phase WFL  CHL IP CERVICAL ESOPHAGEAL PHASE 11/10/2019 Cervical Esophageal Phase Impaired Cervical Esophageal Comment esophageal stasis with purees and solids Madison P. Isenhour, M.S., CCC-SLP Speech-Language Pathologist Acute Rehabilitation Services Pager: [place] 11/10/2019, 2:57 PM                Chandlar Guice T. Farrell  If 7PM-7AM, please contact night-coverage [email or web address] Password [facility] 11/10/2019, 3:28 PM

## 2019-11-10 NOTE — Progress Notes (Signed)
Modified Barium Swallow Progress Note  Patient Details  Name: Ellen Skinner MRN: [id number] Date of Birth: [birth year]  Today's Date: 11/10/2019  Modified Barium Swallow completed.  Full report located under Chart Review in the Imaging Section.  Brief recommendations include the following:  Clinical Impression  [name] demonstrates oropharyngeal swallow function WNL without penetration, aspiration, or residue. She had noted esophageal stasis with solids, requiring liquid wash to clear.  Stasis/paresis is common to the diagnosis of scleroderma. Suspect this is leading to her other symptoms. She has no difficulty pharyngeally with solids, but reports after she chews, she just "has trouble sending it back there." Given no A/P transit difficulty with liquids or purees, suspect this may be related to some level of hesitancy subconsciously about solids giving her trouble lower in her GI tract. With solids on this exam, pt had mildly prolonged mastication, but functional A/P transit and clearance. Esophageal clearance required liquid wash.  Recommend: regular/unrestricted solids (pt to order what she is comfortable with), thin liquids, alternate solids/liquids for clearance, sit upright at 90 degrees during and 30 mins after PO intake, follow up with GI for further evaluation/treatment of esophageal deficits.   Swallow Evaluation Recommendations   Recommended Consults: Consider GI evaluation;Consider esophageal assessment   SLP Diet Recommendations: Regular solids;Thin liquid   Liquid Administration via: Cup;Straw   Medication Administration: Whole meds with puree   Supervision: Patient able to self feed   Compensations: Slow rate;Small sips/bites;Follow solids with liquid   Postural Changes: Seated upright at 90 degrees;Remain semi-upright after after feeds/meals (Comment)   Oral Care Recommendations: Oral care BID       Nillie Bartolotta P. Derris Millan, M.S., CCC-SLP Speech-Language  Pathologist Acute Rehabilitation Services Pager: [phone]  Susanne Borders Nakima Fluegge 11/10/2019,3:12 PM

## 2019-11-10 NOTE — Progress Notes (Signed)
  Speech Language Pathology Treatment:   Dysphagia Patient Details Name: Ellen Skinner MRN: [id number] DOB: 1995-04-25 Today's Date: 11/10/2019 Time: 1275-1700 SLP Time Calculation (min) (ACUTE ONLY): 12 min  Assessment / Plan / Recommendation Clinical Impression  Following MBSS completed today, pt had questions and requested SLP return to room for further education and to speak with her mother. Unfortunately, mother had stepped out at this time, but SLP provided written handouts regarding findings of MBSS. MBSS was reviewed with patient and questions answered. Specifically, pt inquired as to when her swallow would return to normal. SLP educated her that her oral and pharyngeal swallow function were considered WNL at this time, but that she should utilize liquid wash to aid in both oral and esophageal clearance. Pt reported she doesn't like mixing liquids with solids in her mouth at the same time, but would if she had to. She was educated on esophageal precautions of ensuring she is upright at 90 degrees for PO intake and 30 mins after PO intake. She demonstrated understanding. She is cognitively and physically capable of ordering her own meals and choosing solids she feels comfortable with. For example, for breakfast, she reporting wanting fruit (banana or mandarin oranges) and yogurt. All questions were answered. SLP to return x1 to educate mother if present and to ensure safety and efficiency with regular diet and thin liquids.    HPI HPI: 24-year-old female who was diagnosed with scleroderma at age 53 but was lost to follow-up was admitted 11/01/19 with large necrotizing pneumonia (R cavitary abscess).  She underwent bronchoscopy 11/03/2019 with BAL.  She has been treated with Augmentin with improvement of her respiratory status. C/o dysphagia to solids.      SLP Plan   2x/week min       Recommendations  Compensations: Slow rate;Small sips/bites;Follow solids with liquid                Follow  up Recommendations: None SLP Visit Diagnosis: Dysphagia, unspecified (R13.10)                      Cindee Mclester P. Ajax Schroll, M.S., [place] Pathologist Acute Rehabilitation Services Pager: [place] 11/10/2019, 3:13 PM

## 2019-11-10 NOTE — Progress Notes (Signed)
Pt had two bowel movements throughout shift. Pt stated that her stomach feels a little better. Abdomen is still distended on assessment.

## 2019-11-11 DIAGNOSIS — E440 Moderate protein-calorie malnutrition: Secondary | ICD-10-CM | POA: Diagnosis not present

## 2019-11-11 DIAGNOSIS — J850 Gangrene and necrosis of lung: Secondary | ICD-10-CM | POA: Diagnosis not present

## 2019-11-11 DIAGNOSIS — M349 Systemic sclerosis, unspecified: Secondary | ICD-10-CM | POA: Diagnosis not present

## 2019-11-11 DIAGNOSIS — J851 Abscess of lung with pneumonia: Secondary | ICD-10-CM | POA: Diagnosis not present

## 2019-11-11 LAB — RENAL FUNCTION PANEL
Albumin: 2.2 g/dL — ABNORMAL LOW (ref 3.5–5.0)
Anion gap: 14 (ref 5–15)
BUN: 40 mg/dL — ABNORMAL HIGH (ref 6–20)
CO2: 28 mmol/L (ref 22–32)
Calcium: 9 mg/dL (ref 8.9–10.3)
Chloride: 95 mmol/L — ABNORMAL LOW (ref 98–111)
Creatinine, Ser: 4.86 mg/dL — ABNORMAL HIGH (ref 0.44–1.00)
GFR calc Af Amer: 14 mL/min — ABNORMAL LOW (ref >60)
GFR calc non Af Amer: 12 mL/min — ABNORMAL LOW (ref >60)
Glucose, Bld: 93 mg/dL (ref 70–99)
Phosphorus: 5.7 mg/dL — ABNORMAL HIGH (ref 2.5–4.6)
Potassium: 4.3 mmol/L (ref 3.5–5.1)
Sodium: 137 mmol/L (ref 135–145)

## 2019-11-11 LAB — CBC WITH DIFFERENTIAL/PLATELET
Abs Immature Granulocytes: 0.15 10*3/uL — ABNORMAL HIGH (ref 0.00–0.07)
Basophils Absolute: 0.1 10*3/uL (ref 0.0–0.1)
Basophils Relative: 0 %
Eosinophils Absolute: 0.8 10*3/uL — ABNORMAL HIGH (ref 0.0–0.5)
Eosinophils Relative: 5 %
HCT: 35.5 % — ABNORMAL LOW (ref 36.0–46.0)
Hemoglobin: 11.2 g/dL — ABNORMAL LOW (ref 12.0–15.0)
Immature Granulocytes: 1 %
Lymphocytes Relative: 11 %
Lymphs Abs: 2 10*3/uL (ref 0.7–4.0)
MCH: 28.5 pg (ref 26.0–34.0)
MCHC: 31.5 g/dL (ref 30.0–36.0)
MCV: 90.3 fL (ref 80.0–100.0)
Monocytes Absolute: 0.9 10*3/uL (ref 0.1–1.0)
Monocytes Relative: 5 %
Neutro Abs: 13.8 10*3/uL — ABNORMAL HIGH (ref 1.7–7.7)
Neutrophils Relative %: 78 %
Platelets: 464 10*3/uL — ABNORMAL HIGH (ref 150–400)
RBC: 3.93 MIL/uL (ref 3.87–5.11)
RDW: 13.4 % (ref 11.5–15.5)
WBC: 17.8 10*3/uL — ABNORMAL HIGH (ref 4.0–10.5)
nRBC: 0 % (ref 0.0–0.2)

## 2019-11-11 LAB — MAGNESIUM: Magnesium: 2 mg/dL (ref 1.7–2.4)

## 2019-11-11 NOTE — Progress Notes (Signed)
PROGRESS NOTE  Ellen Skinner NIO:[id number] DOB: 11/20/1995   PCP: System, Pcp Not In  Patient is from: Home  DOA: 11/01/2019 LOS: 51  Brief Narrative / Interim history: 24 year old female who was diagnosed with scleroderma at age 50 but was lost to follow-up was admitted 11/01/19 with large necrotizing pneumonia.  She underwent bronchoscopy 11/03/2019 with BAL.  COVID-19 PCR, respiratory culture including fungal culture and blood cultures negative. She has been treated with Augmentin with improvement of her respiratory status.    Patient developed acute renal failure with creatinine rising from 0.8 to 9.5.  AKI thought to be iatrogenic from vancomycin, Zosyn, Toradol and contrast versus scleroderma renal crisis. Nephrology consulted.  Renal ultrasound without hydronephrosis but echogenic kidneys of nonspecific renal parenchymal disease.  Eventually renal function improved.  Nephrology signed off.  She also developed ileus that raised concern for possible SBO. However, she started having bowel movements with MiraLAX.  Patient will be discharged once CrCl>30 to allow for full dose Augmentin.  Patient needs outpatient follow-up with rheumatology and pulmonology.  Patient's mother calling [facility].   Subjective: Seen and examined earlier this morning.  No major events overnight of this morning.  Patient's mother at bedside.  She feels well.  Had some loose bowel movements overnight.  Denies chest pain, dyspnea, nausea, vomiting or abdominal pain.  Still with some cough but improved.  Patient's mother at bedside.   Objective: Vitals:   11/10/19 0743 11/10/19 1601 11/10/19 2057 11/11/19 0716  BP: 110/74 124/75 116/77 105/71  Pulse: 93 (!) 103 97 90  Resp: _0 Temp: 98.5 F (36.9 C) 98.6 F (37 C) 98.4 F (36.9 C) 99.2 F (37.3 C)  TempSrc:   Oral   SpO2: 98% 92% 97% 99%  Weight:      Height:        Intake/Output Summary (Last 24 hours) at 11/11/2019 1410 Last data filed at 11/10/2019  2100 Gross per 24 hour  Intake 120 ml  Output --  Net 120 ml   Filed Weights   11/07/19 0443 11/07/19 2148 11/08/19 0500  Weight: 59.4 kg 49.5 kg 54.5 kg    Examination:  GENERAL: No apparent distress.  Nontoxic. HEENT: MMM.  Vision and hearing grossly intact.  NECK: Supple.  No apparent JVD.  RESP: On RA.  No IWOB.  Fair aeration bilaterally. CVS:  RRR. Heart sounds normal.  ABD/GI/GU: BS+. Abd soft, NTND.  MSK/EXT:  Moves extremities.  Sclerodactyly SKIN: no apparent skin lesion or wound NEURO: Awake, alert and oriented appropriately.  No apparent focal neuro deficit. PSYCH: Calm. Normal affect.   Procedures:  None  Microbiology summarized: COVID-19 PCR negative Respiratory culture with few normal respiratory flora Blood cultures negative Fungal culture negative  Assessment & Plan: Necrotizing pneumonia-improved.  Leukocytosis improving -Vancomycin, ceftriaxone and Zosyn 8/3-8/4> IV Unasyn> Augmentin at least for 4 weeks after discharge -Continue mucolytic/antitussive as needed -Pulmonology signed off-recommended p.o. Augmentin for about 4 weeks, outpatient rheum and pulm  Acute renal failure/azotemia-thought to be iatrogenic from antibiotics, NSAID and IV contrast. Cr 0.8 (admit)>> 9.5 (peak)>>> 6.7> 4.86.  -Avoid nephrotoxic meds -Continue monitoring -Nephrology signed off-appreciate input.  Ileus versus SBO-seems to have resolved.  Had multiple bowel movements.  No nausea or vomiting. -P.o. MiraLAX as needed -Avoid opiates -Mobilize patient  Dysphagia-cleared by SLP. -On regular diet -May need GI evaluation outpatient  Scleroderma -Outpatient follow-up with rheumatology-patient's mother calling [facility] to set up this  Hypokalemia-resolved -Replenish and recheck  Hyperphosphatemia: Likely  due to renal failure.  Improving. -Continue monitoring  Leukocytosis/bandemia: Likely due to #1.  Improving. -Continue monitoring  Normocytic anemia: H&H  relatively stable. -Continue monitoring -Check anemia panel  Moderate malnutrition-in the setting of acute illness Body mass index is 22.7 kg/m. Nutrition Problem: Moderate Malnutrition Etiology: chronic illness (emphysema) Signs/Symptoms: mild fat depletion, mild muscle depletion Interventions: Ensure Enlive (each supplement provides 350kcal and 20 grams of protein), Magic cup   DVT prophylaxis:  Place and maintain sequential compression device Start: 11/05/19 1835  Code Status: Full code Family Communication: Updated patient's mother at bedside Status is: Inpatient  Remains inpatient appropriate because:Inpatient level of care appropriate due to severity of illness and Uncontrolled CrCl is over 30 to allow full dose antibiotics   Dispo:  Patient From: Home  Planned Disposition: Home with Health Care Svc  Expected discharge date: 11/14/2019  Medically stable for discharge: No        Consultants:  PCCM Nephrology   Sch Meds:  Scheduled Meds: . amoxicillin-clavulanate  1 tablet Oral Daily  . chlorpheniramine-HYDROcodone  5 mL Oral Q12H  . feeding supplement (ENSURE ENLIVE)  237 mL Oral BID BM  . nystatin  5 mL Oral QID  . sodium chloride flush  3 mL Intravenous Once   Continuous Infusions:  PRN Meds:.acetaminophen, albuterol, menthol-cetylpyridinium, ondansetron (ZOFRAN) IV, polyethylene glycol  Antimicrobials: Anti-infectives (From admission, onward)   Start     Dose/Rate Route Frequency Ordered Stop   11/11/19 1000  amoxicillin-clavulanate (AUGMENTIN) 500-125 MG per tablet 500 mg     Discontinue     1 tablet Oral Daily 11/10/19 1915     11/10/19 2200  amoxicillin-clavulanate (AUGMENTIN) 875-125 MG per tablet 1 tablet  Status:  Discontinued        1 tablet Oral Every 12 hours 11/10/19 1854 11/10/19 1913   11/09/19 2200  ampicillin-sulbactam (UNASYN) 1.5 g in sodium chloride 0.9 % 100 mL IVPB  Status:  Discontinued        1.5 g 200 mL/hr over 30 Minutes  Intravenous Every 24 hours 11/09/19 1847 11/10/19 1854   11/07/19 1800  amoxicillin-clavulanate (AUGMENTIN) 500-125 MG per tablet 500 mg  Status:  Discontinued        1 tablet Oral Every 24 hours 11/07/19 1358 11/09/19 1819   11/06/19 1530  amoxicillin-clavulanate (AUGMENTIN) 875-125 MG per tablet 1 tablet  Status:  Discontinued        1 tablet Oral Every 24 hours 11/06/19 1503 11/07/19 1358   11/05/19 0154  ampicillin-sulbactam (UNASYN) 1.5 g in sodium chloride 0.9 % 100 mL IVPB  Status:  Discontinued        1.5 g 200 mL/hr over 30 Minutes Intravenous Every 24 hours 11/04/19 0910 11/06/19 1503   11/03/19 1400  ampicillin-sulbactam (UNASYN) 1.5 g in sodium chloride 0.9 % 100 mL IVPB  Status:  Discontinued        1.5 g 200 mL/hr over 30 Minutes Intravenous Every 12 hours 11/03/19 0739 11/04/19 0910   11/02/19 1000  vancomycin (VANCOCIN) IVPB 1000 mg/200 mL premix  Status:  Discontinued        1,000 mg 200 mL/hr over 60 Minutes Intravenous Every 12 hours 11/02/19 0359 11/03/19 0726   11/02/19 0600  piperacillin-tazobactam (ZOSYN) IVPB 3.375 g  Status:  Discontinued        3.375 g 12.5 mL/hr over 240 Minutes Intravenous Every 8 hours 11/02/19 0359 11/03/19 0726   11/02/19 0145  vancomycin (VANCOCIN) IVPB 1000 mg/200 mL premix  1,000 mg 200 mL/hr over 60 Minutes Intravenous  Once 11/02/19 0138 11/02/19 0340   11/02/19 0145  cefTRIAXone (ROCEPHIN) 2 g in sodium chloride 0.9 % 100 mL IVPB        2 g 200 mL/hr over 30 Minutes Intravenous  Once 11/02/19 0138 11/02/19 0233       I have personally reviewed the following labs and images: CBC: Recent Labs  Lab 11/05/19 0657 11/06/19 0110 11/08/19 0242 11/10/19 0330 11/11/19 0112  WBC 27.5* 23.2* 20.5* 18.9* 17.8*  NEUTROABS  --   --   --  15.5* 13.8*  HGB 12.0 10.3* 11.9* 10.6* 11.2*  HCT 37.8 30.4* 36.3 32.9* 35.5*  MCV 93.3 86.9 88.1 87.7 90.3  PLT 284 330 289 392 464*   BMP &GFR Recent Labs  Lab 11/07/19 0121 11/08/19 0242  11/09/19 0132 11/10/19 0330 11/11/19 0112  NA 138 140 137 136 137  K 3.9 4.2 3.4* 3.4* 4.3  CL 98 96* 91* 92* 95*  CO2 _0 GLUCOSE 103* 97 136* 102* 93  BUN 56* 58* 53* 49* 40*  CREATININE 9.86* 9.52* 8.18* 6.69* 4.86*  CALCIUM 7.8* 8.2* 8.3* 8.4* 9.0  MG  --   --   --   --  2.0  PHOS 8.3* 8.8* 7.5* 6.5* 5.7*   Estimated Creatinine Clearance: 13.7 mL/min (A) (by C-G formula based on SCr of 4.86 mg/dL (H)). Liver & Pancreas: Recent Labs  Lab 11/07/19 0121 11/08/19 0242 11/09/19 0132 11/10/19 0330 11/11/19 0112  ALBUMIN 1.5* 2.0* 2.1* 2.1* 2.2*   No results for input(s): LIPASE, AMYLASE in the last 168 hours. No results for input(s): AMMONIA in the last 168 hours. Diabetic: No results for input(s): HGBA1C in the last 72 hours. No results for input(s): GLUCAP in the last 168 hours. Cardiac Enzymes: No results for input(s): CKTOTAL, CKMB, CKMBINDEX, TROPONINI in the last 168 hours. No results for input(s): PROBNP in the last 8760 hours. Coagulation Profile: No results for input(s): INR, PROTIME in the last 168 hours. Thyroid Function Tests: No results for input(s): TSH, T4TOTAL, FREET4, T3FREE, THYROIDAB in the last 72 hours. Lipid Profile: No results for input(s): CHOL, HDL, LDLCALC, TRIG, CHOLHDL, LDLDIRECT in the last 72 hours. Anemia Panel: No results for input(s): VITAMINB12, FOLATE, FERRITIN, TIBC, IRON, RETICCTPCT in the last 72 hours. Urine analysis:    Component Value Date/Time   COLORURINE AMBER (A) 11/03/2019 1100   APPEARANCEUR CLOUDY (A) 11/03/2019 1100   LABSPEC 1.018 11/03/2019 1100   PHURINE 5.0 11/03/2019 1100   GLUCOSEU NEGATIVE 11/03/2019 1100   HGBUR NEGATIVE 11/03/2019 1100   BILIRUBINUR NEGATIVE 11/03/2019 1100   KETONESUR NEGATIVE 11/03/2019 1100   PROTEINUR 100 (A) 11/03/2019 1100   NITRITE NEGATIVE 11/03/2019 1100   LEUKOCYTESUR NEGATIVE 11/03/2019 1100   Sepsis Labs: Invalid input(s): PROCALCITONIN,  [place]  Microbiology: Recent Results (from the past 240 hour(s))  SARS Coronavirus 2 by RT PCR (hospital order, performed in [facility] hospital lab) Nasopharyngeal Nasopharyngeal Swab     Status: None   Collection Time: 11/02/19  2:48 AM   Specimen: Nasopharyngeal Swab  Result Value Ref Range Status   SARS Coronavirus 2 NEGATIVE NEGATIVE Final    Comment: (NOTE) SARS-CoV-2 target nucleic acids are NOT DETECTED.  The SARS-CoV-2 RNA is generally detectable in upper and lower respiratory specimens during the acute phase of infection. The lowest concentration of SARS-CoV-2 viral copies this assay can detect is 250 copies / mL. A negative result does not  preclude SARS-CoV-2 infection and should not be used as the sole basis for treatment or other patient management decisions.  A negative result may occur with improper specimen collection / handling, submission of specimen other than nasopharyngeal swab, presence of viral mutation(s) within the areas targeted by this assay, and inadequate number of viral copies (<250 copies / mL). A negative result must be combined with clinical observations, patient history, and epidemiological information.  Fact Sheet for Patients:   StrictlyIdeas.no  Fact Sheet for Healthcare Providers: BankingDealers.co.za  This test is not yet approved or  cleared by the [place] FDA and has been authorized for detection and/or diagnosis of SARS-CoV-2 by FDA under an Emergency Use Authorization (EUA).  This EUA will remain in effect (meaning this test can be used) for the duration of the COVID-19 declaration under Section 564(b)(1) of the Act, 21 U.S.C. section 360bbb-3(b)(1), unless the authorization is terminated or revoked sooner.  Performed at [facility], [place] [address], [place], [place] [address]   MRSA PCR Screening     Status: None   Collection Time: 11/02/19  4:05 AM   Specimen:  Nasopharyngeal  Result Value Ref Range Status   MRSA by PCR NEGATIVE NEGATIVE Final    Comment:        The GeneXpert MRSA Assay (FDA approved for NASAL specimens only), is one component of a comprehensive MRSA colonization surveillance program. It is not intended to diagnose MRSA infection nor to guide or monitor treatment for MRSA infections. Performed at [facility], [place] [address], [place], [place] [address]   Culture, blood (routine x 2) Call MD if unable to obtain prior to antibiotics being given     Status: None   Collection Time: 11/02/19  4:40 AM   Specimen: BLOOD  Result Value Ref Range Status   Specimen Description BLOOD RIGHT ARM  Final   Special Requests   Final    BOTTLES DRAWN AEROBIC AND ANAEROBIC Blood Culture results may not be optimal due to an inadequate volume of blood received in culture bottles   Culture   Final    NO GROWTH 5 DAYS Performed at [facility], [place] [address], [place], [place] [address]    Report Status 11/07/2019 FINAL  Final  Culture, sputum-assessment     Status: None   Collection Time: 11/02/19  8:31 AM   Specimen: Expectorated Sputum  Result Value Ref Range Status   Specimen Description Expect. Sput  Final   Special Requests NONE  Final   Sputum evaluation   Final    THIS SPECIMEN IS ACCEPTABLE FOR SPUTUM CULTURE Performed at [facility], [address], [place], [place] [address]    Report Status 11/02/2019 FINAL  Final  Culture, respiratory     Status: None   Collection Time: 11/02/19  8:31 AM  Result Value Ref Range Status   Specimen Description Expect. Sput  Final   Special Requests NONE Reflexed from [id number]  Final   Gram Stain   Final    ABUNDANT WBC PRESENT,BOTH PMN AND MONONUCLEAR FEW GRAM NEGATIVE RODS MODERATE GRAM POSITIVE COCCI ABUNDANT GRAM VARIABLE ROD    Culture   Final    FEW Normal respiratory flora-no Staph aureus or Pseudomonas seen Performed at [facility], [address]  [place], [place] [address]    Report Status 11/04/2019 FINAL  Final  Culture, respiratory     Status: None   Collection Time: 11/03/19 10:51 AM  Specimen: PATH Cytology Bronchial lavage; Respiratory  Result Value Ref Range Status   Specimen Description Bronch [place]  Final   Special Requests NONE  Final   Gram Stain   Final    RARE WBC PRESENT,BOTH PMN AND MONONUCLEAR NO ORGANISMS SEEN    Culture   Final    NO GROWTH 2 DAYS Performed at [facility], [address], [place], [place] [address]    Report Status 11/05/2019 FINAL  Final  Fungus Culture With Stain     Status: None (Preliminary result)   Collection Time: 11/03/19 10:58 AM   Specimen: PATH Cytology Bronchial lavage  Result Value Ref Range Status   Fungus Stain Final report  Final    Comment: (NOTE) Performed At: [facility] [place], [place] [id number] Rush Farmer MD [id number]    Fungus (Mycology) Culture PENDING  Incomplete   Fungal Source BRONCHIAL ALVEOLAR LAVAGE  Final    Comment: Performed at [facility], [place] [address] [place], [place] [address]  Fungus Culture Result     Status: None   Collection Time: 11/03/19 10:58 AM  Result Value Ref Range Status   Result 1 Comment  Final    Comment: (NOTE) KOH/Calcofluor preparation:  no fungus observed. Performed At: [facility] [place], [place] [id number] Rush Farmer MD [id number]     Radiology Studies: DG Swallowing Func-Speech Pathology  Result Date: 11/10/2019 Objective Swallowing Evaluation: Type of Study: MBS-Modified Barium Swallow Study  Patient Details Name: Lavona Norsworthy MRN: [id number] Date of Birth: [birth year] Today's Date: 11/10/2019 Time: SLP Start Time (ACUTE ONLY): 1330 -SLP Stop Time (ACUTE ONLY): 1410 SLP Time Calculation (min) (ACUTE ONLY): 40 min Past Medical History: Past Medical History: Diagnosis Date . COPD (chronic obstructive pulmonary disease) (HCC)   scleraderma, emphysema .  Pneumonia  . Scleroderma involving lung [facility])  Past Surgical History: Past Surgical History: Procedure Laterality Date . BRONCHIAL WASHINGS  11/03/2019  Procedure: BRONCHIAL WASHINGS;  Surgeon: Collene Gobble, MD;  Location: [facility] ENDOSCOPY;  Service: Cardiopulmonary;; . VIDEO BRONCHOSCOPY Right 11/03/2019  Procedure: VIDEO BRONCHOSCOPY WITHOUT FLUORO;  Surgeon: Collene Gobble, MD;  Location: [place];  Service: Cardiopulmonary;  Laterality: Right; HPI: 71-year-old female who was diagnosed with scleroderma at age 13 but was lost to follow-up was admitted 11/01/19 with large necrotizing pneumonia (R cavitary abscess).  She underwent bronchoscopy 11/03/2019 with BAL.  She has been treated with Augmentin with improvement of her respiratory status. C/o dysphagia to solids.  Subjective: Pt agreeable to MBSS. Assessment / Plan / Recommendation CHL IP CLINICAL IMPRESSIONS 11/10/2019 Clinical Impression Ms. Romanski demonstrates oropharyngeal swallow function WNL without penetration, aspiration, or residue. She had noted esophageal stasis with solids, requiring liquid wash to clear.  Stasis/paresis is common to the diagnosis of scleroderma. Suspect this is leading to her other symptoms. She has no difficulty pharyngeally with solids, but reports after she chews, she just "has trouble sending it back there." Given no A/P transit difficulty with liquids or purees, suspect this may be related to some level of hesitancy subconsciously about solids giving her trouble lower in her GI tract. With solids on this exam, pt had mildly prolonged mastication, but functional A/P transit and clearance. Esophageal clearance required liquid wash (see image below of esophagus, taken 60 seconds after the swallow). Recommend: regular/unrestricted solids (pt to order what she is comfortable with), thin liquids, alternate solids/liquids for clearance, sit upright at 90 degrees during and 30 mins after PO  intake, follow up with GI for further  evaluation/treatment of esophageal deficits. SLP Visit Diagnosis Dysphagia, unspecified (R13.10)  Impact on safety and function Mild aspiration risk   CHL IP TREATMENT RECOMMENDATION 11/10/2019 Treatment Recommendations Therapy as outlined in treatment plan below   Prognosis 11/10/2019 Prognosis for Safe Diet Advancement Good Barriers to Reach Goals -- Barriers/Prognosis Comment -- CHL IP DIET RECOMMENDATION 11/10/2019 SLP Diet Recommendations Regular solids;Thin liquid Liquid Administration via Cup;Straw Medication Administration Whole meds with puree Compensations Slow rate;Small sips/bites;Follow solids with liquid Postural Changes Seated upright at 90 degrees;Remain semi-upright after after feeds/meals (Comment)   CHL IP OTHER RECOMMENDATIONS 11/10/2019 Recommended Consults Consider GI evaluation;Consider esophageal assessment Oral Care Recommendations Oral care BID Other Recommendations --   CHL IP FOLLOW UP RECOMMENDATIONS 11/10/2019 Follow up Recommendations None   CHL IP FREQUENCY AND DURATION 11/10/2019 Speech Therapy Frequency (ACUTE ONLY) min 2x/week Treatment Duration 1 week      CHL IP ORAL PHASE 11/10/2019 Oral Phase WFL  CHL IP PHARYNGEAL PHASE 11/10/2019 Pharyngeal Phase WFL  CHL IP CERVICAL ESOPHAGEAL PHASE 11/10/2019 Cervical Esophageal Phase Impaired Cervical Esophageal Comment esophageal stasis with purees and solids Madison P. Isenhour, M.S., CCC-SLP Speech-Language Pathologist Acute Rehabilitation Services Pager: [place] 11/10/2019, 2:57 PM                Kathi Dohn T. [place]  If 7PM-7AM, please contact night-coverage [email or web address] Password [email or web address] 11/11/2019, 2:10 PM

## 2019-11-12 LAB — RENAL FUNCTION PANEL
Albumin: 2.2 g/dL — ABNORMAL LOW (ref 3.5–5.0)
Anion gap: 14 (ref 5–15)
BUN: 35 mg/dL — ABNORMAL HIGH (ref 6–20)
CO2: 25 mmol/L (ref 22–32)
Calcium: 8.8 mg/dL — ABNORMAL LOW (ref 8.9–10.3)
Chloride: 99 mmol/L (ref 98–111)
Creatinine, Ser: 3.29 mg/dL — ABNORMAL HIGH (ref 0.44–1.00)
GFR calc Af Amer: 22 mL/min — ABNORMAL LOW (ref >60)
GFR calc non Af Amer: 19 mL/min — ABNORMAL LOW (ref >60)
Glucose, Bld: 108 mg/dL — ABNORMAL HIGH (ref 70–99)
Phosphorus: 4.9 mg/dL — ABNORMAL HIGH (ref 2.5–4.6)
Potassium: 3.9 mmol/L (ref 3.5–5.1)
Sodium: 138 mmol/L (ref 135–145)

## 2019-11-12 LAB — CBC
HCT: 33.3 % — ABNORMAL LOW (ref 36.0–46.0)
Hemoglobin: 10.7 g/dL — ABNORMAL LOW (ref 12.0–15.0)
MCH: 29.1 pg (ref 26.0–34.0)
MCHC: 32.1 g/dL (ref 30.0–36.0)
MCV: 90.5 fL (ref 80.0–100.0)
Platelets: 472 10*3/uL — ABNORMAL HIGH (ref 150–400)
RBC: 3.68 MIL/uL — ABNORMAL LOW (ref 3.87–5.11)
RDW: 13.3 % (ref 11.5–15.5)
WBC: 15.3 10*3/uL — ABNORMAL HIGH (ref 4.0–10.5)
nRBC: 0 % (ref 0.0–0.2)

## 2019-11-12 LAB — RETICULOCYTES
Immature Retic Fract: 4.5 % (ref 2.3–15.9)
RBC.: 3.67 MIL/uL — ABNORMAL LOW (ref 3.87–5.11)
Retic Count, Absolute: 24.2 10*3/uL (ref 19.0–186.0)
Retic Ct Pct: 0.7 % (ref 0.4–3.1)

## 2019-11-12 LAB — IRON AND TIBC
Iron: 26 ug/dL — ABNORMAL LOW (ref 28–170)
Saturation Ratios: 13 % (ref 10.4–31.8)
TIBC: 195 ug/dL — ABNORMAL LOW (ref 250–450)
UIBC: 169 ug/dL

## 2019-11-12 LAB — FOLATE: Folate: 10.4 ng/mL (ref >5.9)

## 2019-11-12 LAB — VITAMIN B12: Vitamin B-12: 1065 pg/mL — ABNORMAL HIGH (ref 180–914)

## 2019-11-12 LAB — FERRITIN: Ferritin: 197 ng/mL (ref 11–307)

## 2019-11-12 LAB — MAGNESIUM: Magnesium: 1.8 mg/dL (ref 1.7–2.4)

## 2019-11-12 MED ORDER — AMOXICILLIN-POT CLAVULANATE 500-125 MG PO TABS
1.0000 | ORAL_TABLET | Freq: Two times a day (BID) | ORAL | Status: DC
  Administered 2019-11-12 – 2019-11-14 (×4): 500 mg via ORAL
  Filled 2019-11-12 (×4): qty 1

## 2019-11-12 NOTE — TOC Initial Note (Signed)
Transition of Care [facility]) - Initial/Assessment Note    Patient Details  Name: Ellen Skinner MRN: [id number] Date of Birth: 02-Apr-1995  Transition of Care [facility]) CM/SW Contact:    Joanne Chars, LCSW Phone Number: 11/12/2019, 2:49 PM  Clinical Narrative:  Met with pt and mother to discuss discharge plan.  Pt will be returning to [facility] in [place] for school shortly.  Mother has contacted [facility] in [place], [phone], and obtained PCP appt 10/1.   CSW spoke with [facility] medical and was able to get hospital discharge appt on 12-06-22.  This was passed on to mother and MD informed.                  Expected Discharge Plan: Home/Self Care Barriers to Discharge: Continued Medical Work up   Patient Goals and CMS Choice        Expected Discharge Plan and Services Expected Discharge Plan: Home/Self Care       Living arrangements for the past 2 months: Single Family Home                                      Prior Living Arrangements/Services Living arrangements for the past 2 months: Single Family Home Lives with:: Parents Patient language and need for interpreter reviewed:: Yes Do you feel safe going back to the place where you live?: Yes      Need for Family Participation in Patient Care: No (Comment) Care giver support system in place?: Yes (comment)   Criminal Activity/Legal Involvement Pertinent to Current Situation/Hospitalization: No - Comment as needed  Activities of Daily Living Home Assistive Devices/Equipment: None ADL Screening (condition at time of admission) Patient's cognitive ability adequate to safely complete daily activities?: Yes Is the patient deaf or have difficulty hearing?: No Does the patient have difficulty seeing, even when wearing glasses/contacts?: No Does the patient have difficulty concentrating, remembering, or making decisions?: No Patient able to express need for assistance with ADLs?: Yes Does the patient have difficulty  dressing or bathing?: No Independently performs ADLs?: Yes (appropriate for developmental age) Does the patient have difficulty walking or climbing stairs?: No Weakness of Legs: None Weakness of Arms/Hands: None  Permission Sought/Granted Permission sought to share information with : PCP, Family Supports Permission granted to share information with : Yes, Verbal Permission Granted  Share Information with NAME: Emmie Niemann, mother  Permission granted to share info w AGENCY: PCP [facility], [place]        Emotional Assessment Appearance:: Appears stated age Attitude/Demeanor/Rapport: Engaged Affect (typically observed): Pleasant Orientation: : Oriented to Place, Oriented to Self, Oriented to  Time, Oriented to Situation Alcohol / Substance Use: Not Applicable Psych Involvement: No (comment)  Admission diagnosis:  Necrotic pneumonia ([place]) [J85.0] Necrotizing pneumonia ([place]) [J85.0] History of scleroderma [Z87.39] Patient Active Problem List   Diagnosis Date Noted  . Malnutrition of moderate degree 11/10/2019  . Necrotizing pneumonia ([place]) 11/02/2019  . 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 Pulmonary abscess ([place]) 11/02/2019  . Sepsis due to pneumonia ([place]) 11/02/2019   PCP:  System, Pcp Not In Pharmacy:   [id number] [address] [place] [address]Phone: [phone]: [phone]    Social Determinants of Health (SDOH) Interventions    Readmission Risk Interventions No flowsheet data found.

## 2019-11-12 NOTE — Progress Notes (Signed)
PROGRESS NOTE  Ellen Skinner [id number]:[id number] DOB: July 19, 1995   PCP: System, Pcp Not In  Patient is from: Home  DOA: 11/01/2019 LOS: 73  Brief Narrative / Interim history: 24 year old female who was diagnosed with scleroderma at age 67 but was lost to follow-up was admitted 11/01/19 with large necrotizing pneumonia.  She underwent bronchoscopy 11/03/2019 with BAL.  COVID-19 PCR, respiratory culture including fungal culture and blood cultures negative. She has been treated with Augmentin with improvement of her respiratory status.    Patient developed acute renal failure with creatinine rising from 0.8 to 9.5.  AKI thought to be iatrogenic from vancomycin, Zosyn, Toradol and contrast versus scleroderma renal crisis. Nephrology consulted.  Renal ultrasound without hydronephrosis but echogenic kidneys of nonspecific renal parenchymal disease.  Eventually renal function improved.  Nephrology signed off.  She also developed ileus that raised concern for possible SBO. However, she started having bowel movements with MiraLAX.  Patient will be discharged once CrCl>30 to allow for full dose Augmentin.  Patient needs new PCP and outpatient follow-up with rheumatology and pulmonology.  Patient's mother calling [facility].   Subjective: Seen and examined earlier this morning. No major events overnight or this morning. No complaints other than mild intermittent dry cough. Denies chest pain, dyspnea, GI or UTI symptoms. Patient's mother at bedside.  Objective: Vitals:   11/11/19 0716 11/11/19 1614 11/11/19 2105 11/12/19 0918  BP: 105/71 110/75 119/71 (!) 121/102  Pulse: 90 93 100 98  Resp: _0 Temp: 99.2 F (37.3 C) 98.9 F (37.2 C) 98.9 F (37.2 C) 97.6 F (36.4 C)  TempSrc:   Oral   SpO2: 99% 92% 100% 98%  Weight:      Height:        Intake/Output Summary (Last 24 hours) at 11/12/2019 1434 Last data filed at 11/11/2019 2100 Gross per 24 hour  Intake 120 ml  Output --  Net 120 ml   Filed  Weights   11/07/19 0443 11/07/19 2148 11/08/19 0500  Weight: 59.4 kg 49.5 kg 54.5 kg    Examination:  GENERAL: No apparent distress.  Nontoxic. HEENT: MMM.  Vision and hearing grossly intact.  NECK: Supple.  No apparent JVD.  RESP: On RA. No IWOB.  Fair aeration bilaterally. CVS:  RRR. Heart sounds normal.  ABD/GI/GU: BS+. Abd soft, NTND.  MSK/EXT:  Moves extremities. No apparent deformity. No edema.  SKIN: no apparent skin lesion or wound NEURO: Awake, alert and oriented appropriately.  No apparent focal neuro deficit. PSYCH: Calm. Normal affect.  Procedures:  None  Microbiology summarized: COVID-19 PCR negative Respiratory culture with few normal respiratory flora Blood cultures negative Fungal culture negative  Assessment & Plan: Necrotizing pneumonia/infected bullae-improved.  Leukocytosis improving -Vancomycin, ceftriaxone and Zosyn 8/3-8/4> IV Unasyn> Augmentin at least for 4 weeks after discharge -Continue mucolytic/antitussive as needed -Pulm signed off-recommended p.o. Augmentin for ~4 weeks  Paraseptal emphysema, connective tissue, related ILD -Per PCCM, ummunosuppressive regimen, DMARD therapy will have to be delayed until she clears this infection -Outpatient rheum and pulm-TOC to help patient/mother  Acute renal failure/azotemia-thought to be iatrogenic from antibiotics, NSAID and IV contrast. Cr 0.8 (admit)>> 9.5 (peak)>>> 6.7> 3.29 -Avoid nephrotoxic meds -Continue monitoring -Nephrology signed off-appreciate input.  Ileus versus SBO-resolved. -P.o. MiraLAX as needed -Avoid opiates -Mobilize patient  Dysphagia-cleared by SLP. -On regular diet -May need GI evaluation outpatient  Scleroderma -Outpatient follow-up with rheumatology-TOC helping  Hypokalemia-resolved -Replenish and recheck  Hyperphosphatemia: Likely due to renal failure.  Improving. -Continue monitoring  Leukocytosis/bandemia: Likely due to #1.  Improving. -Continue  monitoring  Normocytic anemia: H&H relatively stable.  Anemia panel consistent with anemia of chronic disease but could be affected by the infectious process -Continue monitoring  Care coordination-patient has no PCP.  Also need outpatient follow-up with rheumatology and pulmonology.  Lives in [place] help by [facility] connecting her with PCP and specialists.   Moderate malnutrition-in the setting of acute illness Body mass index is 22.7 kg/m. Nutrition Problem: Moderate Malnutrition Etiology: chronic illness (emphysema) Signs/Symptoms: mild fat depletion, mild muscle depletion Interventions: Ensure Enlive (each supplement provides 350kcal and 20 grams of protein), Magic cup   DVT prophylaxis:  Place and maintain sequential compression device Start: 11/05/19 1835  Code Status: Full code Family Communication: Updated patient's mother at bedside Status is: Inpatient  Remains inpatient appropriate because:Inpatient level of care appropriate due to severity of illness and Uncontrolled CrCl is over 30 to allow full dose antibiotics   Dispo:  Patient From: Home  Planned Disposition: home on full dose Augmentin if CrCl >30.  Expected discharge date: 11/13/2019  Medically stable for discharge: No        Consultants:  PCCM Nephrology   Sch Meds:  Scheduled Meds: . amoxicillin-clavulanate  1 tablet Oral Daily  . chlorpheniramine-HYDROcodone  5 mL Oral Q12H  . feeding supplement (ENSURE ENLIVE)  237 mL Oral BID BM  . nystatin  5 mL Oral QID  . sodium chloride flush  3 mL Intravenous Once   Continuous Infusions:  PRN Meds:.acetaminophen, albuterol, menthol-cetylpyridinium, ondansetron (ZOFRAN) IV, polyethylene glycol  Antimicrobials: Anti-infectives (From admission, onward)   Start     Dose/Rate Route Frequency Ordered Stop   11/11/19 1000  amoxicillin-clavulanate (AUGMENTIN) 500-125 MG per tablet 500 mg     Discontinue     1 tablet Oral Daily 11/10/19 1915      11/10/19 2200  amoxicillin-clavulanate (AUGMENTIN) 875-125 MG per tablet 1 tablet  Status:  Discontinued        1 tablet Oral Every 12 hours 11/10/19 1854 11/10/19 1913   11/09/19 2200  ampicillin-sulbactam (UNASYN) 1.5 g in sodium chloride 0.9 % 100 mL IVPB  Status:  Discontinued        1.5 g 200 mL/hr over 30 Minutes Intravenous Every 24 hours 11/09/19 1847 11/10/19 1854   11/07/19 1800  amoxicillin-clavulanate (AUGMENTIN) 500-125 MG per tablet 500 mg  Status:  Discontinued        1 tablet Oral Every 24 hours 11/07/19 1358 11/09/19 1819   11/06/19 1530  amoxicillin-clavulanate (AUGMENTIN) 875-125 MG per tablet 1 tablet  Status:  Discontinued        1 tablet Oral Every 24 hours 11/06/19 1503 11/07/19 1358   11/05/19 0154  ampicillin-sulbactam (UNASYN) 1.5 g in sodium chloride 0.9 % 100 mL IVPB  Status:  Discontinued        1.5 g 200 mL/hr over 30 Minutes Intravenous Every 24 hours 11/04/19 0910 11/06/19 1503   11/03/19 1400  ampicillin-sulbactam (UNASYN) 1.5 g in sodium chloride 0.9 % 100 mL IVPB  Status:  Discontinued        1.5 g 200 mL/hr over 30 Minutes Intravenous Every 12 hours 11/03/19 0739 11/04/19 0910   11/02/19 1000  vancomycin (VANCOCIN) IVPB 1000 mg/200 mL premix  Status:  Discontinued        1,000 mg 200 mL/hr over 60 Minutes Intravenous Every 12 hours 11/02/19 0359 11/03/19 0726   11/02/19 0600  piperacillin-tazobactam (ZOSYN) IVPB 3.375 g  Status:  Discontinued  3.375 g 12.5 mL/hr over 240 Minutes Intravenous Every 8 hours 11/02/19 0359 11/03/19 0726   11/02/19 0145  vancomycin (VANCOCIN) IVPB 1000 mg/200 mL premix        1,000 mg 200 mL/hr over 60 Minutes Intravenous  Once 11/02/19 0138 11/02/19 0340   11/02/19 0145  cefTRIAXone (ROCEPHIN) 2 g in sodium chloride 0.9 % 100 mL IVPB        2 g 200 mL/hr over 30 Minutes Intravenous  Once 11/02/19 0138 11/02/19 0233       I have personally reviewed the following labs and images: CBC: Recent Labs  Lab  11/06/19 0110 11/08/19 0242 11/10/19 0330 11/11/19 0112 11/12/19 0123  WBC 23.2* 20.5* 18.9* 17.8* 15.3*  NEUTROABS  --   --  15.5* 13.8*  --   HGB 10.3* 11.9* 10.6* 11.2* 10.7*  HCT 30.4* 36.3 32.9* 35.5* 33.3*  MCV 86.9 88.1 87.7 90.3 90.5  PLT 330 289 392 464* 472*   BMP &GFR Recent Labs  Lab 11/08/19 0242 11/09/19 0132 11/10/19 0330 11/11/19 0112 11/12/19 0123  NA 140 137 136 137 138  K 4.2 3.4* 3.4* 4.3 3.9  CL 96* 91* 92* 95* 99  CO2 _0 GLUCOSE 97 136* 102* 93 108*  BUN 58* 53* 49* 40* 35*  CREATININE 9.52* 8.18* 6.69* 4.86* 3.29*  CALCIUM 8.2* 8.3* 8.4* 9.0 8.8*  MG  --   --   --  2.0 1.8  PHOS 8.8* 7.5* 6.5* 5.7* 4.9*   Estimated Creatinine Clearance: 20.2 mL/min (A) (by C-G formula based on SCr of 3.29 mg/dL (H)). Liver & Pancreas: Recent Labs  Lab 11/08/19 0242 11/09/19 0132 11/10/19 0330 11/11/19 0112 11/12/19 0123  ALBUMIN 2.0* 2.1* 2.1* 2.2* 2.2*   No results for input(s): LIPASE, AMYLASE in the last 168 hours. No results for input(s): AMMONIA in the last 168 hours. Diabetic: No results for input(s): HGBA1C in the last 72 hours. No results for input(s): GLUCAP in the last 168 hours. Cardiac Enzymes: No results for input(s): CKTOTAL, CKMB, CKMBINDEX, TROPONINI in the last 168 hours. No results for input(s): PROBNP in the last 8760 hours. Coagulation Profile: No results for input(s): INR, PROTIME in the last 168 hours. Thyroid Function Tests: No results for input(s): TSH, T4TOTAL, FREET4, T3FREE, THYROIDAB in the last 72 hours. Lipid Profile: No results for input(s): CHOL, HDL, LDLCALC, TRIG, CHOLHDL, LDLDIRECT in the last 72 hours. Anemia Panel: Recent Labs    11/12/19 0123  VITAMINB12 1,065*  FOLATE 10.4  FERRITIN 197  TIBC 195*  IRON 26*  RETICCTPCT 0.7   Urine analysis:    Component Value Date/Time   COLORURINE AMBER (A) 11/03/2019 1100   APPEARANCEUR CLOUDY (A) 11/03/2019 1100   LABSPEC 1.018 11/03/2019 1100    PHURINE 5.0 11/03/2019 1100   GLUCOSEU NEGATIVE 11/03/2019 1100   HGBUR NEGATIVE 11/03/2019 1100   BILIRUBINUR NEGATIVE 11/03/2019 1100   KETONESUR NEGATIVE 11/03/2019 1100   PROTEINUR 100 (A) 11/03/2019 1100   NITRITE NEGATIVE 11/03/2019 1100   LEUKOCYTESUR NEGATIVE 11/03/2019 1100   Sepsis Labs: Invalid input(s): PROCALCITONIN, [place]  Microbiology: Recent Results (from the past 240 hour(s))  Culture, respiratory     Status: None   Collection Time: 11/03/19 10:51 AM   Specimen: PATH Cytology Bronchial lavage; Respiratory  Result Value Ref Range Status   Specimen Description Bronch Lavag  Final   Special Requests NONE  Final   Gram Stain   Final    RARE WBC PRESENT,BOTH PMN  AND MONONUCLEAR NO ORGANISMS SEEN    Culture   Final    NO GROWTH 2 DAYS Performed at [facility], [place] [address], [place], [place] [address]    Report Status 11/05/2019 FINAL  Final  Fungus Culture With Stain     Status: None (Preliminary result)   Collection Time: 11/03/19 10:58 AM   Specimen: PATH Cytology Bronchial lavage  Result Value Ref Range Status   Fungus Stain Final report  Final    Comment: (NOTE) Performed At: [facility] [place], [place] [id number] Rush Farmer MD OV:[id number]    Fungus (Mycology) Culture PENDING  Incomplete   Fungal Source BRONCHIAL ALVEOLAR LAVAGE  Final    Comment: Performed at [facility], [place] [address], [place],  [address]  Fungus Culture Result     Status: None   Collection Time: 11/03/19 10:58 AM  Result Value Ref Range Status   Result 1 Comment  Final    Comment: (NOTE) KOH/Calcofluor preparation:  no fungus observed. Performed At: [facility] [place], [place] [id number] Rush Farmer MD VU:[id number]     Radiology Studies: No results found.   Naiomi Musto T. [place]  If 7PM-7AM, please contact night-coverage [email or web address] Password [facility] 11/12/2019, 2:34 PM

## 2019-11-12 NOTE — Progress Notes (Signed)
PHARMACY NOTE:  ANTIMICROBIAL RENAL DOSAGE ADJUSTMENT  Current antimicrobial regimen includes a mismatch between antimicrobial dosage and estimated renal function.  As per policy approved by the Pharmacy & Therapeutics and Medical Executive Committees, the antimicrobial dosage will be adjusted accordingly.  Current antimicrobial dosage:  Augmentin 500 mg daily   Indication: Necrotizing pneumonia   Renal Function:  Estimated Creatinine Clearance: 20.2 mL/min (A) (by C-G formula based on SCr of 3.29 mg/dL (H)). []      On intermittent HD, scheduled: []      On CRRT    Antimicrobial dosage has been changed to:  Increase to augmentin 500 mg twice daily. Will increase to TID once CrCl > 30 mL/min   Additional comments:   Thank you for allowing pharmacy to be a part of this patient's care.  , PharmD., BCPS, BCCCP Clinical Pharmacist

## 2019-11-13 LAB — CBC
HCT: 34.6 % — ABNORMAL LOW (ref 36.0–46.0)
Hemoglobin: 10.8 g/dL — ABNORMAL LOW (ref 12.0–15.0)
MCH: 28.6 pg (ref 26.0–34.0)
MCHC: 31.2 g/dL (ref 30.0–36.0)
MCV: 91.8 fL (ref 80.0–100.0)
Platelets: 477 10*3/uL — ABNORMAL HIGH (ref 150–400)
RBC: 3.77 MIL/uL — ABNORMAL LOW (ref 3.87–5.11)
RDW: 13.5 % (ref 11.5–15.5)
WBC: 19.1 10*3/uL — ABNORMAL HIGH (ref 4.0–10.5)
nRBC: 0 % (ref 0.0–0.2)

## 2019-11-13 LAB — RENAL FUNCTION PANEL
Albumin: 2.2 g/dL — ABNORMAL LOW (ref 3.5–5.0)
Anion gap: 11 (ref 5–15)
BUN: 29 mg/dL — ABNORMAL HIGH (ref 6–20)
CO2: 26 mmol/L (ref 22–32)
Calcium: 8.7 mg/dL — ABNORMAL LOW (ref 8.9–10.3)
Chloride: 99 mmol/L (ref 98–111)
Creatinine, Ser: 2.65 mg/dL — ABNORMAL HIGH (ref 0.44–1.00)
GFR calc Af Amer: 29 mL/min — ABNORMAL LOW (ref >60)
GFR calc non Af Amer: 25 mL/min — ABNORMAL LOW (ref >60)
Glucose, Bld: 97 mg/dL (ref 70–99)
Phosphorus: 5.2 mg/dL — ABNORMAL HIGH (ref 2.5–4.6)
Potassium: 3.7 mmol/L (ref 3.5–5.1)
Sodium: 136 mmol/L (ref 135–145)

## 2019-11-13 MED ORDER — GUAIFENESIN 100 MG/5ML PO SOLN
5.0000 mL | ORAL | Status: DC | PRN
  Administered 2019-11-13: 100 mg via ORAL
  Filled 2019-11-13: qty 5

## 2019-11-13 NOTE — Progress Notes (Signed)
Ellen Skinner  QQP:[id number] DOB: 09-02-1995 DOA: 11/01/2019 PCP: System, Pcp Not In    Brief Narrative:  24 year old with unmanaged scleroderma who was admitted to [facility] 11/01/2019 with severe necrotizing pneumonia. She underwent bronchoscopy with BAL 8/4. Respiratory cultures including fungal cultures and blood cultures have all been negative. She has been empirically treated with Augmentin with clinical improvement.  Her hospital stay has been complicated by the development of severe acute renal failure with creatinine peaking at 9.5. This was thought to be related to vancomycin, Zosyn, Toradol, and contrast used. Nephrology has been following. Renal ultrasound did not reveal hydronephrosis. Fortunately her renal function improved. She also transiently suffer with an ileus but this was able to be resolved with use of MiraLAX.  At such time that the patient's renal function improves to allow full dose Augmentin she will be discharged.  Significant Events:  8/2 admit via [place] 8/4 bronchoscopy with BAL  Subjective: Vital signs are stable and the patient is afebrile. Renal function is slowly but steadily improving.  Resting comfortably in bed.  Anxious to be discharged home when safe.  Of note the patient's mother has arranged an outpatient follow-up appointment for her on Friday, August 20 in [place].  Antimicrobials:  Zosyn 8/2 > 8/3 Vancomycin 8/2 > 8/3 Unasyn 8/4 >8/6 Augmentin 8/7 >  DVT prophylaxis: SCDs  Assessment & Plan:  Necrotizing pneumonia with infected bullae Clinically improving -antibiotics have been transitioned to Augmentin with a plan for 4 weeks of ongoing treatment post discharge -continue as needed mucolytic's and antitussives -has been seen by PCCM during this hospital stay  Interstitial lung disease -paraseptal emphysema -scleroderma Immunosuppressive considerations will need to be held until patient has fully cleared her active  infection -to follow-up with PCCM and Rheumatology as an outpatient  Acute renal failure Creatinine peaked at 9.5 -likely related to antibiotics, NSAID, and IV contrast -nephrology has seen  Recent Labs  Lab 11/09/19 0132 11/10/19 0330 11/11/19 0112 11/12/19 0123 11/13/19 0213  CREATININE 8.18* 6.69* 4.86* 3.29* 2.65*    Ileus Resolved  Hypokalemia Corrected with supplementation  Hyperphosphatemia Due to severe acute renal failure -trending downward with improvement in renal function  Normocytic anemia Anemia panel consistent with anemia of chronic disease  Moderate malnutrition in the setting of acute illness   Code Status: FULL CODE Family Communication:  Status is: Inpatient  Remains inpatient appropriate because:Inpatient level of care appropriate due to severity of illness   Dispo:  Patient From: Home  Planned Disposition: Home with Health Care Svc  Expected discharge date: 11/05/19  Medically stable for discharge: No   Consultants:  PCCM  Objective: Blood pressure 122/75, pulse (!) 102, temperature 98.7 F (37.1 C), resp. rate 18, height _0  (1.549 m), weight 54.5 kg, last menstrual period 11/03/2019, SpO2 97 %.  Intake/Output Summary (Last 24 hours) at 11/13/2019 0940 Last data filed at 11/12/2019 2200 Gross per 24 hour  Intake 640 ml  Output --  Net 640 ml   Filed Weights   11/07/19 0443 11/07/19 2148 11/08/19 0500  Weight: 59.4 kg 49.5 kg 54.5 kg    Examination: General: No acute respiratory distress Lungs: Clear to auscultation bilaterally without wheezes or crackles Cardiovascular: Regular rate and rhythm without murmur gallop or rub normal S1 and S2 Abdomen: Nontender, nondistended, soft, bowel sounds positive, no rebound, no ascites, no appreciable mass Extremities: No significant cyanosis, clubbing, or edema bilateral lower extremities  CBC: Recent Labs  Lab 11/10/19 0330 11/10/19  0330 11/11/19 0112 11/12/19 0123 11/13/19 0213   WBC 18.9*   < > 17.8* 15.3* 19.1*  NEUTROABS 15.5*  --  13.8*  --   --   HGB 10.6*   < > 11.2* 10.7* 10.8*  HCT 32.9*   < > 35.5* 33.3* 34.6*  MCV 87.7   < > 90.3 90.5 91.8  PLT 392   < > 464* 472* 477*   < > = values in this interval not displayed.   Basic Metabolic Panel: Recent Labs  Lab 11/11/19 0112 11/12/19 0123 11/13/19 0213  NA 137 138 136  K 4.3 3.9 3.7  CL 95* 99 99  CO2 _0 GLUCOSE 93 108* 97  BUN 40* 35* 29*  CREATININE 4.86* 3.29* 2.65*  CALCIUM 9.0 8.8* 8.7*  MG 2.0 1.8  --   PHOS 5.7* 4.9* 5.2*   GFR: Estimated Creatinine Clearance: 25.1 mL/min (A) (by C-G formula based on SCr of 2.65 mg/dL (H)).  Liver Function Tests: Recent Labs  Lab 11/10/19 0330 11/11/19 0112 11/12/19 0123 11/13/19 0213  ALBUMIN 2.1* 2.2* 2.2* 2.2*    Recent Results (from the past 240 hour(s))  Culture, respiratory     Status: None   Collection Time: 11/03/19 10:51 AM   Specimen: PATH Cytology Bronchial lavage; Respiratory  Result Value Ref Range Status   Specimen Description Bronch Lavag  Final   Special Requests NONE  Final   Gram Stain   Final    RARE WBC PRESENT,BOTH PMN AND MONONUCLEAR NO ORGANISMS SEEN    Culture   Final    NO GROWTH 2 DAYS Performed at [facility], [address], [place], [place] [address]    Report Status 11/05/2019 FINAL  Final  Fungus Culture With Stain     Status: None (Preliminary result)   Collection Time: 11/03/19 10:58 AM   Specimen: PATH Cytology Bronchial lavage  Result Value Ref Range Status   Fungus Stain Final report  Final    Comment: (NOTE) Performed At: [facility] [place], [place] [id number] Rush Farmer MD [id number]    Fungus (Mycology) Culture PENDING  Incomplete   Fungal Source BRONCHIAL ALVEOLAR LAVAGE  Final    Comment: Performed at [facility], [place] [address], [place], [place] [address]  Fungus Culture Result     Status: None   Collection Time: 11/03/19 10:58 AM   Result Value Ref Range Status   Result 1 Comment  Final    Comment: (NOTE) KOH/Calcofluor preparation:  no fungus observed. Performed At: [facility] [place], [place] [id number] Rush Farmer MD GP:[id number]      Scheduled Meds: . amoxicillin-clavulanate  1 tablet Oral BID  . chlorpheniramine-HYDROcodone  5 mL Oral Q12H  . feeding supplement (ENSURE ENLIVE)  237 mL Oral BID BM  . nystatin  5 mL Oral QID  . sodium chloride flush  3 mL Intravenous Once     LOS: 11 days   Cherene Altes, MD [facility] Office  [phone] Pager - Text Page per Shea Evans  If 7PM-7AM, please contact night-coverage per Amion 11/13/2019, 9:40 AM

## 2019-11-14 LAB — RENAL FUNCTION PANEL
Albumin: 2.2 g/dL — ABNORMAL LOW (ref 3.5–5.0)
Anion gap: 10 (ref 5–15)
BUN: 23 mg/dL — ABNORMAL HIGH (ref 6–20)
CO2: 25 mmol/L (ref 22–32)
Calcium: 9 mg/dL (ref 8.9–10.3)
Chloride: 101 mmol/L (ref 98–111)
Creatinine, Ser: 2.22 mg/dL — ABNORMAL HIGH (ref 0.44–1.00)
GFR calc Af Amer: 35 mL/min — ABNORMAL LOW (ref >60)
GFR calc non Af Amer: 30 mL/min — ABNORMAL LOW (ref >60)
Glucose, Bld: 117 mg/dL — ABNORMAL HIGH (ref 70–99)
Phosphorus: 4.3 mg/dL (ref 2.5–4.6)
Potassium: 4.3 mmol/L (ref 3.5–5.1)
Sodium: 136 mmol/L (ref 135–145)

## 2019-11-14 MED ORDER — AMOXICILLIN-POT CLAVULANATE 875-125 MG PO TABS: 1.0000 | ORAL_TABLET | Freq: Two times a day (BID) | ORAL | Status: DC

## 2019-11-14 MED ORDER — AMOXICILLIN-POT CLAVULANATE 875-125 MG PO TABS: 1.0000 | ORAL_TABLET | Freq: Two times a day (BID) | ORAL | 3 refills | Status: AC

## 2019-11-14 MED ORDER — LOPERAMIDE HCL 2 MG PO CAPS
2.0000 mg | ORAL_CAPSULE | ORAL | Status: DC | PRN
  Administered 2019-11-14: 2 mg via ORAL
  Filled 2019-11-14: qty 1

## 2019-11-14 MED ORDER — ACETAMINOPHEN 325 MG PO TABS: 650.0000 mg | ORAL_TABLET | Freq: Four times a day (QID) | ORAL | Status: AC | PRN

## 2019-11-14 NOTE — Progress Notes (Signed)
   11/14/19   To Whom it may concern,  Ellen Skinner was admitted to [facility] on 11/01/2019 and remained under my care in the hospital through 11/14/19. She has been advised that she is cleared to return to school and/or work. Her condition is such that she will require ongoing outpatient medical visits, which may intermittently necessitate leave from work or school.   Sincerely,  Lonia Blood, MD [facility] Office  [phone]

## 2019-11-14 NOTE — Discharge Instructions (Signed)
Community-Acquired Pneumonia, Adult Pneumonia is a type of lung infection that causes swelling in the airways of the lungs. Mucus and fluid may also build up inside the airways. This may cause coughing and difficulty breathing. There are different types of pneumonia. One type can develop while a person is in a hospital. A different type is called community-acquired pneumonia. It develops in people who are not, and have not recently been, in the hospital or another type of health care facility. What are the causes? This condition may be caused by:  Viruses. This is the most common cause of pneumonia.  Bacteria. Community-acquired pneumonia is often caused by Streptococcus pneumoniae bacteria. These bacteria are often passed from one person to another by breathing in droplets from the cough or sneeze of an infected person.  Fungi. This is the least common cause of pneumonia. What increases the risk? The following factors may make you more likely to develop this condition:  Having a chronic disease, such as chronic obstructive pulmonary disease (COPD), asthma, congestive heart failure, cystic fibrosis, diabetes, or kidney disease.  Having early-stage or late-stage HIV.  Having sickle cell disease.  Having had your spleen removed (splenectomy).  Having poor dental hygiene.  Having a medical condition that increases the risk of breathing in (aspirating) secretions from your own mouth and nose.  Having a weakened body defense system (immune system).  Being a smoker.  Traveling to areas where pneumonia-causing germs commonly exist.  Being around animal habitats or animals that have pneumonia-causing germs, including birds, bats, rabbits, cats, and farm animals. What are the signs or symptoms? Symptoms of this condition include:  A dry cough.  A wet (productive) cough.  Fever.  Sweating.  Chest pain, especially when breathing deeply or coughing.  Rapid breathing or difficulty  breathing.  Shortness of breath.  Shaking chills.  Fatigue.  Muscle aches. How is this diagnosed? This condition may be diagnosed based on:  Your medical history.  A physical exam. You may also have tests, including:  Chest X-rays.  Tests of your blood oxygen level and other blood gases.  Tests on blood, mucus (sputum), fluid around your lungs (pleural fluid), and urine. If your pneumonia is severe, other tests may be done to find the exact cause of your illness. How is this treated? Treatment for this condition depends on many factors, such as the cause of your pneumonia, the medicines you take, and other medical conditions that you have. For most adults, treatment and recovery from pneumonia may occur at home. In some cases, treatment must happen in a hospital. Treatment may include:  Medicines that are given by mouth or through an IV, including: ? Antibiotic medicines, if the pneumonia was caused by bacteria. ? Antiviral medicines, if the pneumonia was caused by a virus.  Being given extra oxygen.  Respiratory therapy. Although rare, treating severe pneumonia may include:  Using a machine to help you breathe (mechanical ventilation). This is done if you are not breathing well on your own and you cannot maintain a safe blood oxygen level.  Thoracentesis. This is a procedure to remove fluid from around one lung or both lungs to help you breathe better. Follow these instructions at home:  Medicines  Take over-the-counter and prescription medicines only as told by your health care provider. ? Only take cough medicine if you are losing sleep. Be aware that cough medicine can prevent your body's natural ability to remove mucus from your lungs.  If you were prescribed an antibiotic   medicine, take it as told by your health care provider. Do not stop taking the antibiotic even if you start to feel better. General instructions  Sleep in a semi-upright position at night. Try  sleeping in a reclining chair, or place a few pillows under your head.  Rest as needed and get at least 8 hours of sleep each night.  Drink enough water to keep your urine pale yellow. This will help to thin out mucus secretions in your lungs.  Eat a healthy diet that includes plenty of vegetables, fruits, whole grains, low-fat dairy products, and lean protein.  Do not use any products that contain nicotine or tobacco, such as cigarettes, e-cigarettes, and chewing tobacco. If you need help quitting, ask your health care provider.  Keep all follow-up visits as told by your health care provider. This is important. How is this prevented? You can lower your risk of developing community-acquired pneumonia by:  Getting a pneumococcal vaccine. There are different types and schedules of pneumococcal vaccines. Ask your health care provider which option is best for you. Consider getting the vaccine if: ? You are older than 22 years of age. ? You are older than 22 years of age and are undergoing cancer treatment, have chronic lung disease, or have other medical conditions that affect your immune system. Ask your health care provider if this applies to you.  Getting an influenza vaccine every year. Ask your health care provider which type of vaccine is best for you.  Getting regular checkups from your dentist.  Washing your hands often. If soap and water are not available, use hand sanitizer. Contact a health care provider if:  You have a fever.  You are losing sleep because you cannot control your cough with cough medicine. Get help right away if:  You have worsening shortness of breath.  You have increased chest pain.  Your sickness becomes worse, especially if you are an older adult or have a weakened immune system.  You cough up blood. Summary  Pneumonia is an infection of the lungs.  Community-acquired pneumonia develops in people who have not been in the hospital. It can be caused  by bacteria, viruses, or fungi.  This condition may be treated with antibiotics or antiviral medicines.  Severe cases may require hospitalization, mechanical ventilation, and other procedures to drain fluid from the lungs. This information is not intended to replace advice given to you by your health care provider. Make sure you discuss any questions you have with your health care provider. Document Revised: 11/13/2017 Document Reviewed: 11/13/2017 Elsevier Patient Education  2020 [employer].   Suggestions For Increasing Calories And Protein . Several small meals a day are easier to eat and digest than three large ones. Space meals about 2 to 3 hours apart to maximize comfort. . Stop eating 2 to 3 hours before bed and sleep with your head elevated if gastric reflux (GERD) and heartburn are problems. . Do not eat your favorite foods if you are feeling bad. Save them for when you feel good! . Eat breakfast-type foods at any meal. Eggs are usually easy to eat and are great any time of the day. (The same goes for pancakes and waffles.) . Eat when you feel hungry. Most people have the greatest appetite in the morning because they have not eaten all night. If this is the best meal for you, then pile on those calories and other nutrients in the morning and at lunch. Then you can have a  smaller dinner without losing total calories for the day. . Eat leftovers or nutritious snacks in the afternoon and early evening to round out your day. . Try homemade or commercially prepared nutrition bars and puddings, as well as calorie- and protein-rich liquid nutritional supplements. Benefits of Physical Activity Talk to your doctor about physical activity. Light or moderate physical activity can help maintain muscle and promote an appetite. Walking in the neighborhood or the local mall is a great way to get up, get out, and get moving. If you are unsteady on your feet, try walking around the dining room  table. Save Room for [employer]orie-Rich Food! Drink most fluids between meals instead of with meals. (It is fine to have a sip to help swallow food at meal time.) Fluids (which usually have fewer calories and nutrients than solid food) can take up valuable space in your stomach.  Foods Recommended High-Protein Foods Milk products Add cheese to toast, crackers, sandwiches, baked potatoes, vegetables, soups, noodles, meat, and fruit. Use reduced-fat (2%) or whole milk in place of water when cooking cereal and cream soups. Include cream sauces on vegetables and pasta. Add powdered milk to cream soups and mashed potatoes.  Eggs Have hard-cooked eggs readily available in the refrigerator. Chop and add to salads, casseroles, soups, and vegetables. Make a quick egg salad. All eggs should be well cooked to avoid the risk of harmful bacteria.  Meats, poultry, and fish Add leftover cooked meats to soups, casseroles, salads, and omelets. Make dip by mixing diced, chopped, or shredded meat with sour cream and spices.  Beans, legumes, nuts, and seeds Sprinkle nuts and seeds on cereals, fruit, and desserts such as ice cream, pudding, and custard. Also serve nuts and seeds on vegetables, salads, and pasta. Spread peanut butter on toast, bread, English muffins, and fruit, or blend it in a milk shake. Add beans and peas to salads, soups, casseroles, and vegetable dishes.  High-Calorie Foods Butter, margarine, and  oils Melt butter or margarine over potatoes, rice, pasta, and cooked vegetables. Add melted butter or margarine into soups and casseroles and spread on bread for sandwiches before spreading sandwich spread or peanut butter. Saut or stir-fry vegetables, meats, chicken and fish such as shrimp/scallops in olive or canola oil. A variety of oils add calories and can be used to [employer] meat, chicken, or fish.  Milk products Add whipping cream to desserts, pancakes, waffles, fruit, and hot chocolate, and fold it into  soups and casseroles. Add sour cream to baked potatoes and vegetables.  Salad dressing Use regular (not low-fat or diet) mayonnaise and salad dressing on sandwiches and in dips with vegetables and fruit.   Sweets Add jelly and honey to bread and crackers. Add jam to fruit and ice cream and as a topping over cake.   Copyright 2020  Academy of [employer] Dietetics. All rights reserved.   Scleroderma Scleroderma is a rare and long-term (chronic) disease of the immune system. The immune system protects the body by attacking germs that cause illness. If you have scleroderma, your immune system mistakenly attacks your skin and other parts of your body instead. This is called an autoimmune disease. Scleroderma means hardening of the skin. If you have a mild form of this condition, it may affect only your skin (localized scleroderma). If you have a severe form, it may affect your skin and also your blood vessels, lungs, kidneys, heart, and digestive system (systemic scleroderma). What are the causes? The cause of this condition is  not known. What increases the risk? You are more likely to develop this condition if:  You have a family history of scleroderma.  You are female.  You are 43-67 years old. What are the signs or symptoms? Symptoms of this condition depend on the type of scleroderma you have. They also vary from person to person. Symptoms of localized scleroderma may include:  Discolored patches of skin (morphea). These may be thick and waxy.  Bands of thick, hard skin on your arms, legs, or face (linear scleroderma).  Tightening of the skin that limits how well you can move your joints.  Open skin sores. Symptoms of systemic scleroderma may include:  Discoloration of the fingers and sometimes the toes (Raynaud's phenomenon). Your fingers or toes may turn blue, white, or red. You may also have tingling or numbness. Exposure to cold often triggers this symptom.  Tightening of the  skin of the fingers, hands, arms, neck, and face.  Enlarged blood vessels of the hands, face, and nail beds (telangiectasias).  Calcium deposits under your skin (calcinosis).  Joint pain.  Heartburn.  High blood pressure.  Constipation.  Trouble swallowing (dysphagia).  Trouble breathing. How is this diagnosed? This condition may be diagnosed based on:  Your symptoms and medical history.  A physical exam.  Tests, such as: ? Imaging studies. This may include X-rays and CT scan. ? Blood tests. ? Lung (pulmonary) function tests. Scleroderma can be hard to diagnose because other diseases have many of the same symptoms. You may need to see specialists as directed by your health care provider. How is this treated? There is no cure for this condition, but treatment can relieve symptoms and prevent complications. Mild symptoms may not need treatment. Treatment may include taking medicines to:  Improve blood flow.  Block production of stomach acid to treat heartburn.  Treat high blood pressure caused by kidney disease.  Relieve joint pain and inflammation.  Treat lung symptoms. Follow these instructions at home:  Medicines  Take over-the-counter and prescription medicines only as told by your health care provider.  Do not drive or use heavy machinery while taking prescription pain medicine. Eating and drinking  Eat a healthy diet.  Drink enough fluid to keep your urine pale yellow.  If you have heartburn: ? Eat smaller meals often. ? Avoid spicy and fatty foods. ? Do not eat meals late in the evening. ? Avoid lying down right after you eat. Lifestyle   If you have Raynaud's phenomenon, cold can trigger your symptoms. To prevent symptoms, you can: ? Wear mittens, a hat, a scarf, and warm footwear. ? Dress in layers during cold weather. ? If possible, stay indoors during cold weather.  Wear comfortable shoes that are well cushioned.  Protect your skin with  sunscreen and moisturizers.  Stretch and exercise regularly.  Maintain a healthy weight. General instructions  Learn as much as you can about scleroderma, and work closely with your team of health care providers.  Do not use any products that contain nicotine or tobacco, such as cigarettes and e-cigarettes. If you need help quitting, ask your health care provider.  Ask your health care provider how to check your blood pressure at home. Check it as directed by your health care provider.  Make sure you have a good support system at home.  Use skin moisturizer to keep your skin moist.  Keep all follow-up visits as told by your health care provider. This is important. Contact a health care provider if:  Your scleroderma symptoms change or become worse.  You have concerns about your mental health. Get help right away if:  A finger or toe becomes painful or numb.  A finger or toe turns black or a very dark color.  You have: ? Swelling, pain, or tenderness in an arm or leg. ? Trouble swallowing. ? Trouble breathing. ? Chest pain.  You cough up blood. Summary  Scleroderma is a rare and long-term (chronic) disease of the immune system. If you have scleroderma, your immune system mistakenly attacks your skin and other parts of your body.  Symptoms of this condition depend on the type of scleroderma you have. They also vary from person to person.  There is no cure for this condition, but treatment can relieve symptoms and prevent complications. This information is not intended to replace advice given to you by your health care provider. Make sure you discuss any questions you have with your health care provider. Document Revised: 04/25/2017 Document Reviewed: 04/25/2017 Elsevier Patient Education  2020 [employer].

## 2019-11-14 NOTE — Plan of Care (Signed)
  Problem: Education: Goal: Knowledge of General Education information will improve Description: Including pain rating scale, medication(s)/side effects and non-pharmacologic comfort measures Outcome: Adequate for Discharge   Problem: Health Behavior/Discharge Planning: Goal: Ability to manage health-related needs will improve Outcome: Adequate for Discharge   Problem: Clinical Measurements: Goal: Ability to maintain clinical measurements within normal limits will improve Outcome: Adequate for Discharge Goal: Will remain free from infection Outcome: Adequate for Discharge Goal: Diagnostic test results will improve Outcome: Adequate for Discharge Goal: Respiratory complications will improve Outcome: Adequate for Discharge Goal: Cardiovascular complication will be avoided Outcome: Adequate for Discharge   Problem: Activity: Goal: Risk for activity intolerance will decrease Outcome: Adequate for Discharge   Problem: Nutrition: Goal: Adequate nutrition will be maintained Outcome: Adequate for Discharge   Problem: Coping: Goal: Level of anxiety will decrease Outcome: Adequate for Discharge   Problem: Elimination: Goal: Will not experience complications related to bowel motility Outcome: Adequate for Discharge Goal: Will not experience complications related to urinary retention Outcome: Adequate for Discharge   Problem: Pain Managment: Goal: General experience of comfort will improve Outcome: Adequate for Discharge   Problem: Safety: Goal: Ability to remain free from injury will improve Outcome: Adequate for Discharge   Problem: Skin Integrity: Goal: Risk for impaired skin integrity will decrease Outcome: Adequate for Discharge   Problem: Activity: Goal: Ability to tolerate increased activity will improve Outcome: Adequate for Discharge   Problem: Clinical Measurements: Goal: Ability to maintain a body temperature in the normal range will improve Outcome: Adequate  for Discharge   Problem: Respiratory: Goal: Ability to maintain adequate ventilation will improve Outcome: Adequate for Discharge Goal: Ability to maintain a clear airway will improve Outcome: Adequate for Discharge   Problem: Predicted Suboptimal Nutrient Intake (NI-5.11.1) Goal: Food and/or nutrient delivery Description: Individualized approach for food/nutrient provision. Outcome: Adequate for Discharge   Problem: Malnutrition  (NI-5.2) Goal: Food and/or nutrient delivery Description: Individualized approach for food/nutrient provision. Outcome: Adequate for Discharge   Discharge instructions reviewed with patient and mother.  These included, but were not limited to, the following:  medication and potential side effects, when to call the MD, activity recommendations, dietary recommendations, probiotic information, follow-up appointments, etc.  Comprehension of instructions ascertained via use of "teach-back" technique.

## 2019-11-14 NOTE — Discharge Summary (Signed)
DISCHARGE SUMMARY  Ellen Skinner  MR#: [id number]  DOB:July 24, 1995  Date of Admission: 11/01/2019 Date of Discharge: 11/14/2019  Attending Physician:Temple Sporer Hennie Duos, MD  Patient's ZDG:[id number], Pcp Not In  Consults: PCCM  Disposition: D/C home    Follow-up Appts:  Follow-up [facility]. Go on 11/19/2019.   Why: Please attend your follow up appointment with Dr. Farrel Conners on Friday, 11/19/19, at 930AM.  Please bring your hospital discharge summary. Contact information: [address]  [place], [place] [address] P: [phone]        [facility] Follow up.   Why: The office should call you to arrange for an appointment in 2-3 days.               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 She underwent bronchoscopy with BAL 8/4. Respiratory cultures including fungal cultures and blood cultures were negative. She was empirically treated with Augmentin with clinical improvement.  Her hospital stay has been complicated by the development of severe acute renal failure with creatinine peaking at 9.5. This was thought to be related to vancomycin, Zosyn, Toradol, and contrast. Nephrology evaluated the pt. Renal ultrasound did not reveal hydronephrosis. Fortunately her renal function improved. She also transiently suffered with an ileus but this was able to be  resolved with use of MiraLAX.  Hospital Course:  Necrotizing pneumonia with infected bullae Clinically improving -antibiotics have been transitioned to Augmentin with a plan for 4 weeks of ongoing treatment post discharge -continue as needed mucolytic's and antitussives OTC - has been seen by PCCM during this hospital stay  Interstitial lung disease -paraseptal emphysema -scleroderma Immunosuppressive considerations will need to be held until patient has fully cleared her active infection - to follow-up with PCCM and Rheumatology as an outpatient for long term management considerations   Acute renal failure Creatinine peaked at 9.5 - likely related to antibiotics, NSAID, and IV contrast - nephrology consulted during her hospital stay   Ileus Resolved  Hypokalemia Corrected with supplementation  Hyperphosphatemia Due to severe acute renal failure -trending downward with improvement in renal function  Normocytic anemia Anemia panel consistent with anemia of chronic disease  Moderate malnutrition in the setting of acute illness  Allergies as of 11/14/2019   No Known Allergies     Medication List    STOP taking these medications   ibuprofen 200 MG tablet Commonly known as: ADVIL     TAKE these medications   acetaminophen 325 MG tablet Commonly known as: TYLENOL Take 2 tablets (650 mg total) by mouth every 6 (six) hours as needed for mild pain or headache.   amoxicillin-clavulanate 875-125 MG tablet Commonly known as: AUGMENTIN Take 1 tablet by mouth every 12 (twelve) hours.   Tri-Lo-Marzia 0.18/0.215/0.25 MG-25 MCG tab Generic drug: Norgestimate-Ethinyl Estradiol Triphasic Take 1 tablet by mouth daily.       Day of Discharge BP 111/70 (BP Location: Left Arm)   Pulse 91   Temp 98.7 F (37.1 C)   Resp 18   Ht _0  (1.549  m)   Wt 54.5 kg   LMP 11/03/2019 Comment: no chance of preg-per pt  SpO2 98%   BMI 22.70 kg/m   Physical Exam: General: No acute  respiratory distress Lungs: diffuse fine crackles - good air movement - no wheezing  Cardiovascular: RRR - no M  Abdomen: NT/ND, soft, BS+ Extremities: No significant cyanosis, clubbing, or edema bilateral lower extremities  Basic Metabolic Panel: Recent Labs  Lab 11/10/19 0330 11/11/19 0112 11/12/19 0123 11/13/19 0213 11/14/19 0506  NA 136 137 138 136 136  K 3.4* 4.3 3.9 3.7 4.3  CL 92* 95* 99 99 101  CO2 _0 GLUCOSE 102* 93 108* 97 117*  BUN 49* 40* 35* 29* 23*  CREATININE 6.69* 4.86* 3.29* 2.65* 2.22*  CALCIUM 8.4* 9.0 8.8* 8.7* 9.0  MG  --  2.0 1.8  --   --   PHOS 6.5* 5.7* 4.9* 5.2* 4.3    Liver Function Tests: Recent Labs  Lab 11/10/19 0330 11/11/19 0112 11/12/19 0123 11/13/19 0213 11/14/19 0506  ALBUMIN 2.1* 2.2* 2.2* 2.2* 2.2*    CBC: Recent Labs  Lab 11/08/19 0242 11/10/19 0330 11/11/19 0112 11/12/19 0123 11/13/19 0213  WBC 20.5* 18.9* 17.8* 15.3* 19.1*  NEUTROABS  --  15.5* 13.8*  --   --   HGB 11.9* 10.6* 11.2* 10.7* 10.8*  HCT 36.3 32.9* 35.5* 33.3* 34.6*  MCV 88.1 87.7 90.3 90.5 91.8  PLT 289 392 464* 472* 477*      Time spent in discharge (includes decision making & examination of pt): 35 minutes  11/14/2019, 12:02 PM   Cherene Altes, MD [facility] Office  [phone]

## 2019-11-25 ENCOUNTER — Ambulatory Visit (INDEPENDENT_AMBULATORY_CARE_PROVIDER_SITE_OTHER): Payer: BC Managed Care – PPO

## 2019-11-25 ENCOUNTER — Encounter: Payer: Self-pay | Admitting: Adult Health

## 2019-11-25 ENCOUNTER — Ambulatory Visit: Payer: BC Managed Care – PPO | Admitting: Adult Health

## 2019-11-25 ENCOUNTER — Other Ambulatory Visit: Payer: Self-pay

## 2019-11-25 VITALS — BP 100/70 | HR 119 | Temp 97.0°F | Ht 61.0 in | Wt 107.0 lb

## 2019-11-25 DIAGNOSIS — J181 Lobar pneumonia, unspecified organism: Secondary | ICD-10-CM

## 2019-11-25 DIAGNOSIS — J850 Gangrene and necrosis of lung: Secondary | ICD-10-CM

## 2019-11-25 DIAGNOSIS — M349 Systemic sclerosis, unspecified: Secondary | ICD-10-CM | POA: Diagnosis not present

## 2019-11-25 MED ORDER — BENZONATATE 200 MG PO CAPS
200.0000 mg | ORAL_CAPSULE | Freq: Three times a day (TID) | ORAL | 1 refills | Status: AC | PRN
Start: 2019-11-25 — End: 2020-11-24

## 2019-11-25 NOTE — Progress Notes (Signed)
_0  ID: Dannette Barbara, female    DOB: May 28, 1995, 24 y.o.   MRN: [id number]  Chief Complaint  Patient presents with  . Hospitalization Follow-up    Referring provider: No ref. provider found  HPI: 24 year old female never smoker seen for pulmonary consult during hospitalization for necrotizing pneumonia with infected bullae.  Hospital stay was complicated by acute renal failure felt secondary to vancomycin, Zosyn, contrast dye and Toradol.  Renal ultrasound was unrevealing.  Renal function has steadily improved.  Patient has a history of scleroderma diagnosed at age 57 not on immunosuppression.     TEST/EVENTS :  CT chest 11/02/19 neg PE, Large cavitary process involving majority of the right lower lobe and most of the right middle lobe. There are multiple internal fluid levels within the cavitary process which appears to localize to the lung parenchyma as opposed to the pleural space. Suspect necrotizing pneumonia with probable pulmonary abscess. There is dense consolidation within the right middle and lower lobes. 3. The remaining aerated lung parenchyma is abnormal with presence of emphysematous disease.   11/25/2019 Follow up : Necrotizing pneumonia with infected bullae Patient returns for a follow-up visit.  Patient was recently hospitalized with severe necrotizing pneumonia with an infected bullae right lower lobe and right middle lobe.  Patient has a known history of scleroderma she says diagnosed at age 65.  Had been on some immunosuppression but symptoms had improved with mainly skin changes.  Patient says she had done well and not been on immunosuppression for a while.  Started to get symptoms proximately 1 month prior to admission.  She was treated with aggressive IV antibiotics and discharged on a prolonged course of antibiotics with 4 weeks of Augmentin.  Patient's hospitalization was complicated by acute renal failure felt to be secondary to contrast dye, vancomycin and  Toradol and Zosyn.  Her kidney function steadily improved during hospitalization.  She reports that she was seen earlier this week and told her kidney function had returned back to normal.  Renal ultrasound was unrevealing. Patient has been referred to [facility] rheumatology.  Immunosuppression was held until her infectious process improved/resolved.  Case was discussed with cardiothoracic surgeon who was hesitant to consider surgical intervention as down the line may need potential lung transplant.  Patient has significant emphysema.  Alpha-1 testing was negative.  Since discharge patient says she was starting to do slightly better but over the last 4 days cough has returned she is coughing up some increased mucus and the cough is very aggravating.  She is been using some over-the-counter cough medicines without much relief.  Chest x-ray today shows stable cavitary lesion with air-fluid level.  Patient has slightly improved aeration along the right side but continues to have a large cavitary lesion noted.  Patient's appetite is fair.  She denies any fever or hemoptysis.  Covid vaccine is up-to-date.  No Known Allergies  Immunization History  Administered Date(s) Administered  . Janssen (J&J) SARS-COV-2 Vaccination 07/01/2019    Past Medical History:  Diagnosis Date  . COPD (chronic obstructive pulmonary disease) (HCC)    scleraderma, emphysema  . Pneumonia   . Scleroderma involving lung (HCC)     Tobacco History: Social History   Tobacco Use  Smoking Status Never Smoker  Smokeless Tobacco Never Used   Counseling given: Not Answered   Outpatient Medications Prior to Visit  Medication Sig Dispense Refill  . acetaminophen (TYLENOL) 325 MG tablet Take 2 tablets (650 mg total) by mouth every 6 (  six) hours as needed for mild pain or headache.    Marland Kitchen amoxicillin-clavulanate (AUGMENTIN) 875-125 MG tablet Take 1 tablet by mouth every 12 (twelve) hours. 60 tablet 3  .  menthol-cetylpyridinium (CEPACOL) 3 MG lozenge Take 1 lozenge by mouth as needed for sore throat.    Marland Kitchen OVER THE COUNTER MEDICATION Take 1 drop by mouth as needed. sore throat and cough    . TRI-LO-MARZIA 0.18/0.215/0.25 MG-25 MCG tab Take 1 tablet by mouth daily.     No facility-administered medications prior to visit.     Review of Systems:   Constitutional:   No  weight loss, night sweats,  Fevers, chills, fatigue, or  lassitude.  HEENT:   No headaches,  Difficulty swallowing,  Tooth/dental problems, or  Sore throat,                No sneezing, itching, ear ache, nasal congestion, post nasal drip,   CV:  No chest pain,  Orthopnea, PND, swelling in lower extremities, anasarca, dizziness, palpitations, syncope.   GI  No heartburn, indigestion, abdominal pain, nausea, vomiting, diarrhea, change in bowel habits, loss of appetite, bloody stools.   Resp:  .  No chest wall deformity  Skin: no rash or lesions.  GU: no dysuria, change in color of urine, no urgency or frequency.  No flank pain, no hematuria   MS:  No joint pain or swelling.  No decreased range of motion.  No back pain.    Physical Exam  BP 100/70 (BP Location: Left Arm, Cuff Size: Normal)   Pulse (!) 119   Temp (!) 97 F (36.1 C) (Temporal)   Ht _0  (1.549 m)   Wt 107 lb (48.5 kg)   LMP 11/03/2019 Comment: no chance of preg-per pt  SpO2 97%   BMI 20.22 kg/m   GEN: A/Ox3; pleasant , NAD, well nourished    HEENT:  [place]/AT,  EACs-clear, TMs-wnl, NOSE-clear, THROAT-clear, no lesions, no postnasal drip or exudate noted.   NECK:  Supple w/ fair ROM; no JVD; normal carotid impulses w/o bruits; no thyromegaly or nodules palpated; no lymphadenopathy.    RESP decreased breath sounds on the right  no dullness to percussion  CARD:  RRR, no m/r/g, no peripheral edema, pulses intact, no cyanosis positive clubbing   GI:   Soft & nt; nml bowel sounds; no organomegaly or masses detected.   Musco: Warm bil, no deformities  or joint swelling noted.   Neuro: alert, no focal deficits noted.    Skin: Warm, scleroderma skin changes with tightening along the skin of the face.    Lab Results:  CBC    Component Value Date/Time   WBC 19.1 (H) 11/13/2019 0213   RBC 3.77 (L) 11/13/2019 0213   HGB 10.8 (L) 11/13/2019 0213   HCT 34.6 (L) 11/13/2019 0213   PLT 477 (H) 11/13/2019 0213   MCV 91.8 11/13/2019 0213   MCH 28.6 11/13/2019 0213   MCHC 31.2 11/13/2019 0213   RDW 13.5 11/13/2019 0213   LYMPHSABS 2.0 11/11/2019 0112   MONOABS 0.9 11/11/2019 0112   EOSABS 0.8 (H) 11/11/2019 0112   BASOSABS 0.1 11/11/2019 0112    BMET    Component Value Date/Time   NA 136 11/14/2019 0506   K 4.3 11/14/2019 0506   CL 101 11/14/2019 0506   CO2 25 11/14/2019 0506   GLUCOSE 117 (H) 11/14/2019 0506   BUN 23 (H) 11/14/2019 0506   CREATININE 2.22 (H) 11/14/2019 0506   CALCIUM 9.0  11/14/2019 0506   GFRNONAA 30 (L) 11/14/2019 0506   GFRAA 35 (L) 11/14/2019 0506    BNP No results found for: BNP  ProBNP No results found for: PROBNP  Imaging: DG Chest 2 View  Result Date: 11/25/2019 CLINICAL DATA:  Difficulty breathing, follow-up cavitary lesion EXAM: CHEST - 2 VIEW COMPARISON:  11/01/2019 FINDINGS: Large cavitary lesion is again noted in the right base with air-fluid level within similar to that seen on the prior exam. No new focal infiltrate or sizable effusion is seen. Mild scoliosis of the thoracolumbar spine is noted. IMPRESSION: Stable cavitary lesion with air fluid level within. Electronically Signed   By: Inez Catalina M.D.   On: 11/25/2019 17:20   DG Chest 2 View  Result Date: 11/01/2019 CLINICAL DATA:  Shortness of breath and chest pain EXAM: CHEST - 2 VIEW COMPARISON:  None. FINDINGS: Cardiac shadows within normal limits. The left lung is well aerated and clear. Right lung demonstrates a large cavitary lesion in the right lower lobe with air-fluid level identified. Some adjacent atelectatic changes are noted.  No pneumothorax is seen. No bony abnormality is noted. IMPRESSION: Changes most consistent with a cavitary pneumonia/abscess. CT with contrast is recommended for further delineation. Electronically Signed   By: Inez Catalina M.D.   On: 11/01/2019 12:51   CT Angio Chest PE W and/or Wo Contrast  Result Date: 11/02/2019 CLINICAL DATA:  Chest pain EXAM: CT ANGIOGRAPHY CHEST WITH CONTRAST TECHNIQUE: Multidetector CT imaging of the chest was performed using the standard protocol during bolus administration of intravenous contrast. Multiplanar CT image reconstructions and MIPs were obtained to evaluate the vascular anatomy. CONTRAST:  40m OMNIPAQUE IOHEXOL 350 MG/ML SOLN COMPARISON:  Chest x-ray 11/01/2019 FINDINGS: Cardiovascular: Satisfactory opacification of the pulmonary arteries to the segmental level. No definite acute pulmonary embolus is visualized. Hypoenhancement of distal right pulmonary vessels due to lung consolidation and mass effect from large cavitary process. Aorta is nonaneurysmal. Cardiac size is normal. Mediastinum/Nodes: Midline trachea. No thyroid mass. Probable right hilar adenopathy measuring up to 12 mm. Subcarinal node measuring up to 15 mm. Lungs/Pleura: Underlying emphysematous disease throughout the lungs. Large cavitary process involving the majority of the right lower lobe and much of the right middle lobe with relative sparing of the right upper lobe. Consolidated lung parenchyma in the right middle and lower lobes. Multiple fluid levels are noted. Internal thickened septa within the right lower lobe cavitary process. There is probable small amount of right pleural effusion Upper Abdomen: No acute abnormality. Musculoskeletal: No chest wall abnormality. No acute or significant osseous findings. Review of the MIP images confirms the above findings. IMPRESSION: 1. No definite CT evidence for acute pulmonary embolus. 2. Large cavitary process involving majority of the right lower lobe and  most of the right middle lobe. There are multiple internal fluid levels within the cavitary process which appears to localize to the lung parenchyma as opposed to the pleural space. Suspect necrotizing pneumonia with probable pulmonary abscess. There is dense consolidation within the right middle and lower lobes. 3. The remaining aerated lung parenchyma is abnormal with presence of emphysematous disease. Consider alpha-1 antitrypsin deficiency given patient age and presence of extensive emphysema. 4. Right hilar and mediastinal adenopathy is likely reactive Electronically Signed   By: KDonavan FoilM.D.   On: 11/02/2019 03:00   [place]NAL  Result Date: 11/07/2019 CLINICAL DATA:  Inpatient.  Right flank pain for 1 day. EXAM: RENAL / URINARY TRACT ULTRASOUND COMPLETE COMPARISON:  10/31/2019 renal  sonogram. FINDINGS: Right Kidney: Renal measurements: 13.8 x 6.2 x 5.9 cm = volume: 263 mL. Echogenic right renal parenchyma, normal thickness. No hydronephrosis. No renal mass. Left Kidney: Renal measurements: 12.2 x 7.3 x 7.4 cm = volume: 345 mL. Echogenic left renal parenchyma, normal thickness. No hydronephrosis. No renal mass. Bladder: Appears normal for degree of bladder distention. Other: Bilateral ureteral jets seen in the bladder. IMPRESSION: 1. No hydronephrosis. 2. Echogenic kidneys, unchanged, indicative of nonspecific renal parenchymal disease. 3. Normal bladder. Electronically Signed   By: Ilona Sorrel M.D.   On: 11/07/2019 12:34   US RENAL  Result Date: 11/04/2019 CLINICAL DATA:  Acute kidney injury. EXAM: RENAL / URINARY TRACT ULTRASOUND COMPLETE COMPARISON:  None. FINDINGS: Right Kidney: Renal measurements: 13.1 x 5.6 x 6.4 cm = volume: 248 mL. Increased echogenicity of renal parenchyma is noted suggesting medical renal disease. No mass or hydronephrosis visualized. Left Kidney: Renal measurements: 13.0 x 7.0 x 6.5 cm = volume: 305 mL. Increased echogenicity renal parenchyma is noted suggesting medical  renal disease. No mass or hydronephrosis visualized. Bladder: Appears normal for degree of bladder distention. Other: None. IMPRESSION: Increased echogenicity of renal parenchyma is noted bilaterally suggesting medical renal disease. No hydronephrosis or renal obstruction is noted. Electronically Signed   By: Marijo Conception M.D.   On: 11/04/2019 16:26   DG Abd 2 Views  Result Date: 11/09/2019 CLINICAL DATA:  Lower abdominal pain EXAM: ABDOMEN - 2 VIEW COMPARISON:  November 07, 2019 FINDINGS: Supine and upright images were obtained. There are loops of borderline dilated small bowel with air-fluid levels. No free air. There is a right pleural effusion with atelectatic change in the right mid and lower lung regions. No abnormal calcifications. IMPRESSION: Loops of dilated bowel with air-fluid levels. Question ileus versus a degree of bowel obstruction. No free air. Right pleural effusion with right lower lung region atelectatic change. Electronically Signed   By: Lowella Grip III M.D.   On: 11/09/2019 12:36   DG Abd Portable 1V  Result Date: 11/07/2019 CLINICAL DATA:  Flank pain EXAM: PORTABLE ABDOMEN - 1 VIEW COMPARISON:  None. FINDINGS: No dilated small bowel loops. Mild colonic stool and gas. No evidence of pneumatosis or pneumoperitoneum. No radiopaque nephrolithiasis. IMPRESSION: Nonobstructive bowel gas pattern. Electronically Signed   By: Ilona Sorrel M.D.   On: 11/07/2019 12:35   DG Swallowing Func-Speech Pathology  Result Date: 11/10/2019 Objective Swallowing Evaluation: Type of Study: MBS-Modified Barium Swallow Study  Patient Details Name: Devinn Voshell MRN: [id number] Date of Birth: [birth year] Today's Date: 11/10/2019 Time: SLP Start Time (ACUTE ONLY): 1330 -SLP Stop Time (ACUTE ONLY): 1410 SLP Time Calculation (min) (ACUTE ONLY): 40 min Past Medical History: Past Medical History: Diagnosis Date . COPD (chronic obstructive pulmonary disease) (HCC)   scleraderma, emphysema . Pneumonia  . Scleroderma  involving lung [facility])  Past Surgical History: Past Surgical History: Procedure Laterality Date . BRONCHIAL WASHINGS  11/03/2019  Procedure: BRONCHIAL WASHINGS;  Surgeon: Collene Gobble, MD;  Location: [facility];  Service: Cardiopulmonary;; . VIDEO BRONCHOSCOPY Right 11/03/2019  Procedure: VIDEO BRONCHOSCOPY WITHOUT FLUORO;  Surgeon: Collene Gobble, MD;  Location: [facility];  Service: Cardiopulmonary;  Laterality: Right; HPI: 89-year-old female who was diagnosed with scleroderma at age 59 but was lost to follow-up was admitted 11/01/19 with large necrotizing pneumonia (R cavitary abscess).  She underwent bronchoscopy 11/03/2019 with BAL.  She has been treated with Augmentin with improvement of her respiratory status. C/o dysphagia to solids.  Subjective: Pt agreeable  to MBSS. Assessment / Plan / Recommendation CHL IP CLINICAL IMPRESSIONS 11/10/2019 Clinical Impression Ms. Lippman demonstrates oropharyngeal swallow function WNL without penetration, aspiration, or residue. She had noted esophageal stasis with solids, requiring liquid wash to clear.  Stasis/paresis is common to the diagnosis of scleroderma. Suspect this is leading to her other symptoms. She has no difficulty pharyngeally with solids, but reports after she chews, she just "has trouble sending it back there." Given no A/P transit difficulty with liquids or purees, suspect this may be related to some level of hesitancy subconsciously about solids giving her trouble lower in her GI tract. With solids on this exam, pt had mildly prolonged mastication, but functional A/P transit and clearance. Esophageal clearance required liquid wash (see image below of esophagus, taken 60 seconds after the swallow). Recommend: regular/unrestricted solids (pt to order what she is comfortable with), thin liquids, alternate solids/liquids for clearance, sit upright at 90 degrees during and 30 mins after PO intake, follow up with GI for further evaluation/treatment of esophageal  deficits. SLP Visit Diagnosis Dysphagia, unspecified (R13.10)  Impact on safety and function Mild aspiration risk   CHL IP TREATMENT RECOMMENDATION 11/10/2019 Treatment Recommendations Therapy as outlined in treatment plan below   Prognosis 11/10/2019 Prognosis for Safe Diet Advancement Good Barriers to Reach Goals -- Barriers/Prognosis Comment -- CHL IP DIET RECOMMENDATION 11/10/2019 SLP Diet Recommendations Regular solids;Thin liquid Liquid Administration via Cup;Straw Medication Administration Whole meds with puree Compensations Slow rate;Small sips/bites;Follow solids with liquid Postural Changes Seated upright at 90 degrees;Remain semi-upright after after feeds/meals (Comment)   CHL IP OTHER RECOMMENDATIONS 11/10/2019 Recommended Consults Consider GI evaluation;Consider esophageal assessment Oral Care Recommendations Oral care BID Other Recommendations --   CHL IP FOLLOW UP RECOMMENDATIONS 11/10/2019 Follow up Recommendations None   CHL IP FREQUENCY AND DURATION 11/10/2019 Speech Therapy Frequency (ACUTE ONLY) min 2x/week Treatment Duration 1 week      CHL IP ORAL PHASE 11/10/2019 Oral Phase WFL  CHL IP PHARYNGEAL PHASE 11/10/2019 Pharyngeal Phase WFL  CHL IP CERVICAL ESOPHAGEAL PHASE 11/10/2019 Cervical Esophageal Phase Impaired Cervical Esophageal Comment esophageal stasis with purees and solids Madison P. Isenhour, M.S., CCC-SLP Speech-Language Pathologist Acute Rehabilitation Services Pager: [place] 11/10/2019, 2:57 PM                 No flowsheet data found.  No results found for: NITRICOXIDE      Assessment & Plan:   Necrotizing pneumonia ([place]) Necrotizing pneumonia with an infected bullae Patient appears stable.  Cough has picked up but may be that she is starting to clear out some infection.  She is continue on Augmentin as directed. We will add in cough medicines for comfort.  She may use Mucinex and Tessalon as needed Detailed conversation with patient regarding red  flags to call back our office or seek emergency room care including fever nausea vomiting, hemoptysis, worsening shortness of breath. Case and x-rays were discussed with Dr. Lamonte Sakai   Plan  Patient Instructions  Continue on Augmentin as directed Mucinex Twice daily  As needed  Cough/congestion  Delsym 2 tsp Twice daily  As needed  Cough .  Tessalon Three times a day  As needed  Cough  Follow up with Dr. Lamonte Sakai  In 1 month and As needed   Please contact office for sooner follow up if symptoms do not improve or worsen or seek emergency care       Scleroderma involving lung [facility]) Patient has been referred to [facility] rheumatology.  Most  likely will need referral to [facility] pulmonary as well.Rexene Edison, NP 11/25/2019

## 2019-11-25 NOTE — Patient Instructions (Addendum)
Continue on Augmentin as directed Mucinex Twice daily  As needed  Cough/congestion  Delsym 2 tsp Twice daily  As needed  Cough .  Tessalon Three times a day  As needed  Cough  Follow up with Dr. Delton Coombes  In 1 month and As needed   Please contact office for sooner follow up if symptoms do not improve or worsen or seek emergency care

## 2019-11-25 NOTE — Assessment & Plan Note (Signed)
Patient has been referred to [facility] rheumatology.  Most likely will need referral to [facility] pulmonary as well.Marland Kitchen

## 2019-11-25 NOTE — Assessment & Plan Note (Addendum)
Necrotizing pneumonia with an infected bullae Patient appears stable.  Cough has picked up but may be that she is starting to clear out some infection.  She is continue on Augmentin as directed. We will add in cough medicines for comfort.  She may use Mucinex and Tessalon as needed Detailed conversation with patient regarding red flags to call back our office or seek emergency room care including fever nausea vomiting, hemoptysis, worsening shortness of breath. Case and x-rays were discussed with Dr. Delton Coombes   Plan  Patient Instructions  Continue on Augmentin as directed Mucinex Twice daily  As needed  Cough/congestion  Delsym 2 tsp Twice daily  As needed  Cough .  Tessalon Three times a day  As needed  Cough  Follow up with Dr. Delton Coombes  In 1 month and As needed   Please contact office for sooner follow up if symptoms do not improve or worsen or seek emergency care

## 2019-12-02 LAB — FUNGAL ORGANISM REFLEX

## 2019-12-02 LAB — FUNGUS CULTURE RESULT

## 2019-12-02 LAB — FUNGUS CULTURE WITH STAIN

## 2019-12-28 ENCOUNTER — Encounter: Payer: Self-pay | Admitting: Emergency Medicine

## 2019-12-28 ENCOUNTER — Other Ambulatory Visit: Payer: Self-pay

## 2019-12-28 ENCOUNTER — Ambulatory Visit (INDEPENDENT_AMBULATORY_CARE_PROVIDER_SITE_OTHER): Payer: BC Managed Care – PPO | Admitting: Emergency Medicine

## 2019-12-28 DIAGNOSIS — J850 Gangrene and necrosis of lung: Secondary | ICD-10-CM | POA: Diagnosis not present

## 2019-12-28 NOTE — Addendum Note (Signed)
Addended by: Dorisann Frames R on: 12/28/2019 05:08 PM   Modules accepted: Orders

## 2019-12-28 NOTE — Patient Instructions (Addendum)
We will repeat your CT scan of the chest without contrast to follow your lung abscess and compare with prior. Recommend that you continue to stay off immunosuppressive medications until we are able to obtain the scan and ensure that your lung infection has been adequately treated. Stop your Augmentin now.  Follow with Dr. Delton Coombes next available after the scan so that we can review the results together.

## 2019-12-28 NOTE — Progress Notes (Signed)
Subjective:    Patient ID: Ellen Skinner, female    DOB: [birth year], 24 y.o.   MRN: [id number]  HPI 24 year old never smoker with a history of scleroderma not currently on immunosuppressive therapy, emphysema and some associated ILD.  I met her when she was admitted to the hospital in early August 2021 with right-sided chest pain.  CT scan of the chest showed lung necrosis with an abscess and probably bullitis.  She was treated with Unasyn and then Augmentin for an extended course.  She underwent bronchoscopy 11/03/2019 culture data unrevealing, fungal negative, cytology negative. Her breathing is better. Still with a very mild cough. Still on Augmentin (about 7 weeks). plannig to see Rheum at [facility] 12/16.    Review of Systems As per HPI   Past Medical History:  Diagnosis Date  . COPD (chronic obstructive pulmonary disease) (HCC)    scleraderma, emphysema  . Pneumonia   . Scleroderma involving lung ([place])      No family history on file.   Social History   Socioeconomic History  . Marital status: Single    Spouse name: Not on file  . Number of children: Not on file  . Years of education: Not on file  . Highest education level: Not on file  Occupational History  . Not on file  Tobacco Use  . Smoking status: Never Smoker  . Smokeless tobacco: Never Used  Substance and Sexual Activity  . Alcohol use: Never  . Drug use: Never  . Sexual activity: Not Currently  Other Topics Concern  . Not on file  Social History Narrative  . Not on file   Social Determinants of Health   Financial Resource Strain:   . Difficulty of Paying Living Expenses: Not on file  Food Insecurity:   . Worried About Charity fundraiser in the Last Year: Not on file  . Ran Out of Food in the Last Year: Not on file  Transportation Needs:   . Lack of Transportation (Medical): Not on file  . Lack of Transportation (Non-Medical): Not on file  Physical Activity:   . Days of Exercise per Week: Not on file  .  Minutes of Exercise per Session: Not on file  Stress:   . Feeling of Stress : Not on file  Social Connections:   . Frequency of Communication with Friends and Family: Not on file  . Frequency of Social Gatherings with Friends and Family: Not on file  . Attends Religious Services: Not on file  . Active Member of Clubs or Organizations: Not on file  . Attends Archivist Meetings: Not on file  . Marital Status: Not on file  Intimate Partner Violence:   . Fear of Current or Ex-Partner: Not on file  . Emotionally Abused: Not on file  . Physically Abused: Not on file  . Sexually Abused: Not on file     No Known Allergies   Outpatient Medications Prior to Visit  Medication Sig Dispense Refill  . acetaminophen (TYLENOL) 325 MG tablet Take 2 tablets (650 mg total) by mouth every 6 (six) hours as needed for mild pain or headache.    Marland Kitchen amoxicillin-clavulanate (AUGMENTIN) 875-125 MG tablet Take 1 tablet by mouth every 12 (twelve) hours. 60 tablet 3  . benzonatate (TESSALON) 200 MG capsule Take 1 capsule (200 mg total) by mouth 3 (three) times daily as needed for cough. 45 capsule 1  . menthol-cetylpyridinium (CEPACOL) 3 MG lozenge Take 1 lozenge by mouth as needed  for sore throat.    Marland Kitchen OVER THE COUNTER MEDICATION Take 1 drop by mouth as needed. sore throat and cough    . TRI-LO-MARZIA 0.18/0.215/0.25 MG-25 MCG tab Take 1 tablet by mouth daily.     No facility-administered medications prior to visit.        Objective:   Physical Exam  Today's Vitals   12/28/19 1534  BP: 118/64  Pulse: 65  Temp: 97.6 F (36.4 C)  TempSrc: Temporal  SpO2: 100%  Weight: 110 lb 12.8 oz (50.3 kg)  Height: '5\' 1"'  (1.549 m)   Body mass index is 20.94 kg/m.;sm Gen: Pleasant, thin, in no distress,  normal affect  ENT: No lesions,  mouth clear,  oropharynx clear, no postnasal drip  Neck: No JVD, no stridor  Lungs: No use of accessory muscles, distant with some scattered rhonchi B, no  wheeze  Cardiovascular: RRR, heart sounds normal, no murmur or gallops, no peripheral edema  Musculoskeletal: digital changes from scleroderma, no cyanosis or clubbing  Neuro: alert, awake, non focal  Skin: Warm, no lesions or rash     Assessment & Plan:  Necrotizing pneumonia (HCC) Severe pneumonia with lung abscess, large-scale right lung destruction with bullitis.  She is completed 7+ weeks of antibiotic therapy.  We can stop now.  Plan to repeat her CT chest and characterize parenchyma, degree of recovery.  We certainly need to do this before she would restart DMARD therapy.  Depending on residual disease she may unfortunately require surgical intervention to provide an ultimate cure/resolution.  Hopefully this will not be the case.  I will follow up with her after the CT to review together.  Baltazar Apo, MD, PhD 12/28/2019, 4:07 PM [facility] [phone] or if no answer [phone]

## 2019-12-28 NOTE — Assessment & Plan Note (Signed)
Severe pneumonia with lung abscess, large-scale right lung destruction with bullitis.  She is completed 7+ weeks of antibiotic therapy.  We can stop now.  Plan to repeat her CT chest and characterize parenchyma, degree of recovery.  We certainly need to do this before she would restart DMARD therapy.  Depending on residual disease she may unfortunately require surgical intervention to provide an ultimate cure/resolution.  Hopefully this will not be the case.  I will follow up with her after the CT to review together.

## 2019-12-31 ENCOUNTER — Inpatient Hospital Stay: Admission: RE | Admit: 2019-12-31 | Payer: BC Managed Care – PPO | Source: Ambulatory Visit

## 2019-12-31 ENCOUNTER — Ambulatory Visit: Payer: BC Managed Care – PPO | Admitting: Emergency Medicine

## 2020-01-13 ENCOUNTER — Other Ambulatory Visit: Payer: Self-pay

## 2020-01-13 ENCOUNTER — Ambulatory Visit (INDEPENDENT_AMBULATORY_CARE_PROVIDER_SITE_OTHER)
Admission: RE | Admit: 2020-01-13 | Discharge: 2020-01-13 | Disposition: A | Discharge status: Home / Self Care | Payer: BC Managed Care – PPO | Source: Ambulatory Visit | Attending: Emergency Medicine | Admitting: Emergency Medicine

## 2020-01-13 DIAGNOSIS — J850 Gangrene and necrosis of lung: Secondary | ICD-10-CM | POA: Diagnosis not present

## 2020-02-17 ENCOUNTER — Telehealth: Payer: Self-pay | Admitting: Emergency Medicine

## 2020-02-17 NOTE — Telephone Encounter (Signed)
Lm for patient.  

## 2020-02-18 NOTE — Telephone Encounter (Signed)
Patient is requesting CT results from 01/13/2020.  RB please advise.  Thanks!

## 2020-02-18 NOTE — Telephone Encounter (Signed)
I had expected to see her in office to discuss.  Please let her know that I have reviewed >> there has been significant improvement in her R lung pneumonia and abscess compared with her prior films. There is some residual scarring forming at the bottom of the right lung. This is all good news.   I would still like for her to follow up so we can discuss any further plans that might be indicated.

## 2020-02-18 NOTE — Telephone Encounter (Signed)
Left message for patient to call back on Monday morning.  

## 2020-02-21 NOTE — Telephone Encounter (Signed)
Lmtcb for pt.  

## 2020-03-09 ENCOUNTER — Encounter: Payer: Self-pay | Admitting: Emergency Medicine

## 2020-03-09 NOTE — Telephone Encounter (Signed)
Letter printed and placed in out going mail requesting Patient call office for CT results.  Nothing further at this time.

## 2020-03-09 NOTE — Telephone Encounter (Signed)
lmtcb for pt.  Due to several unsuccessful attempts to reach pt a letter will be sent to pts home. Message can be closed once complete.    Triage, please print and mail letter to pt's home. Close encounter once complete.

## 2020-04-03 ENCOUNTER — Telehealth: Payer: Self-pay | Admitting: Emergency Medicine

## 2020-04-03 NOTE — Telephone Encounter (Signed)
Called and spoke with pt and she is aware of results per RB on her CT scan.  She did not want to make an appt at this time as she has an appt at [facility] with rheumatology.  She will call back at a later date to get scheduled with RB.

## 2021-03-08 IMAGING — DX DG CHEST 2V
2 series · 2 of 2 positions shown · non-contrast
Comparison: None.

CLINICAL DATA: Shortness of breath and chest pain

EXAM:
CHEST - 2 VIEW

[w chest pa]
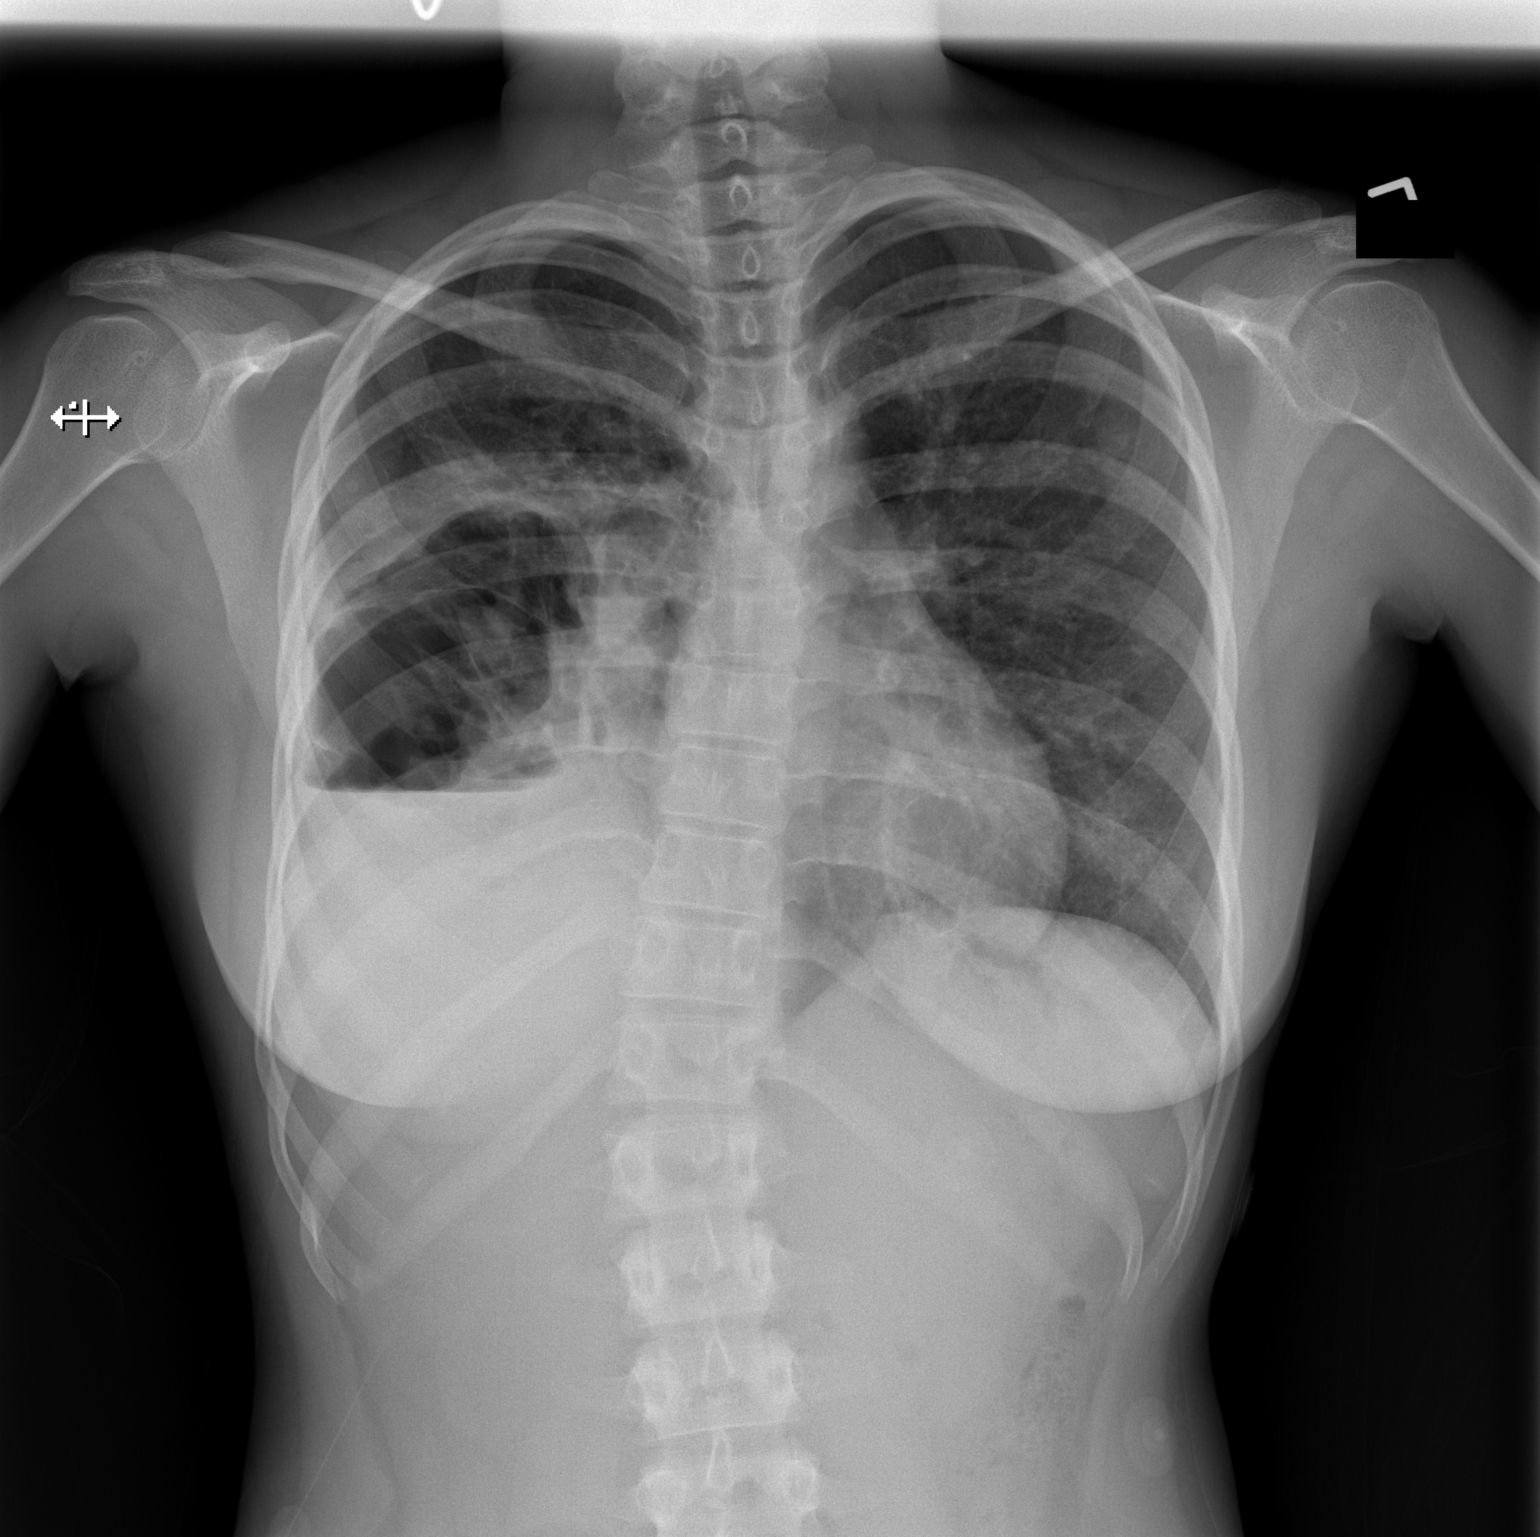

[w chest lat]
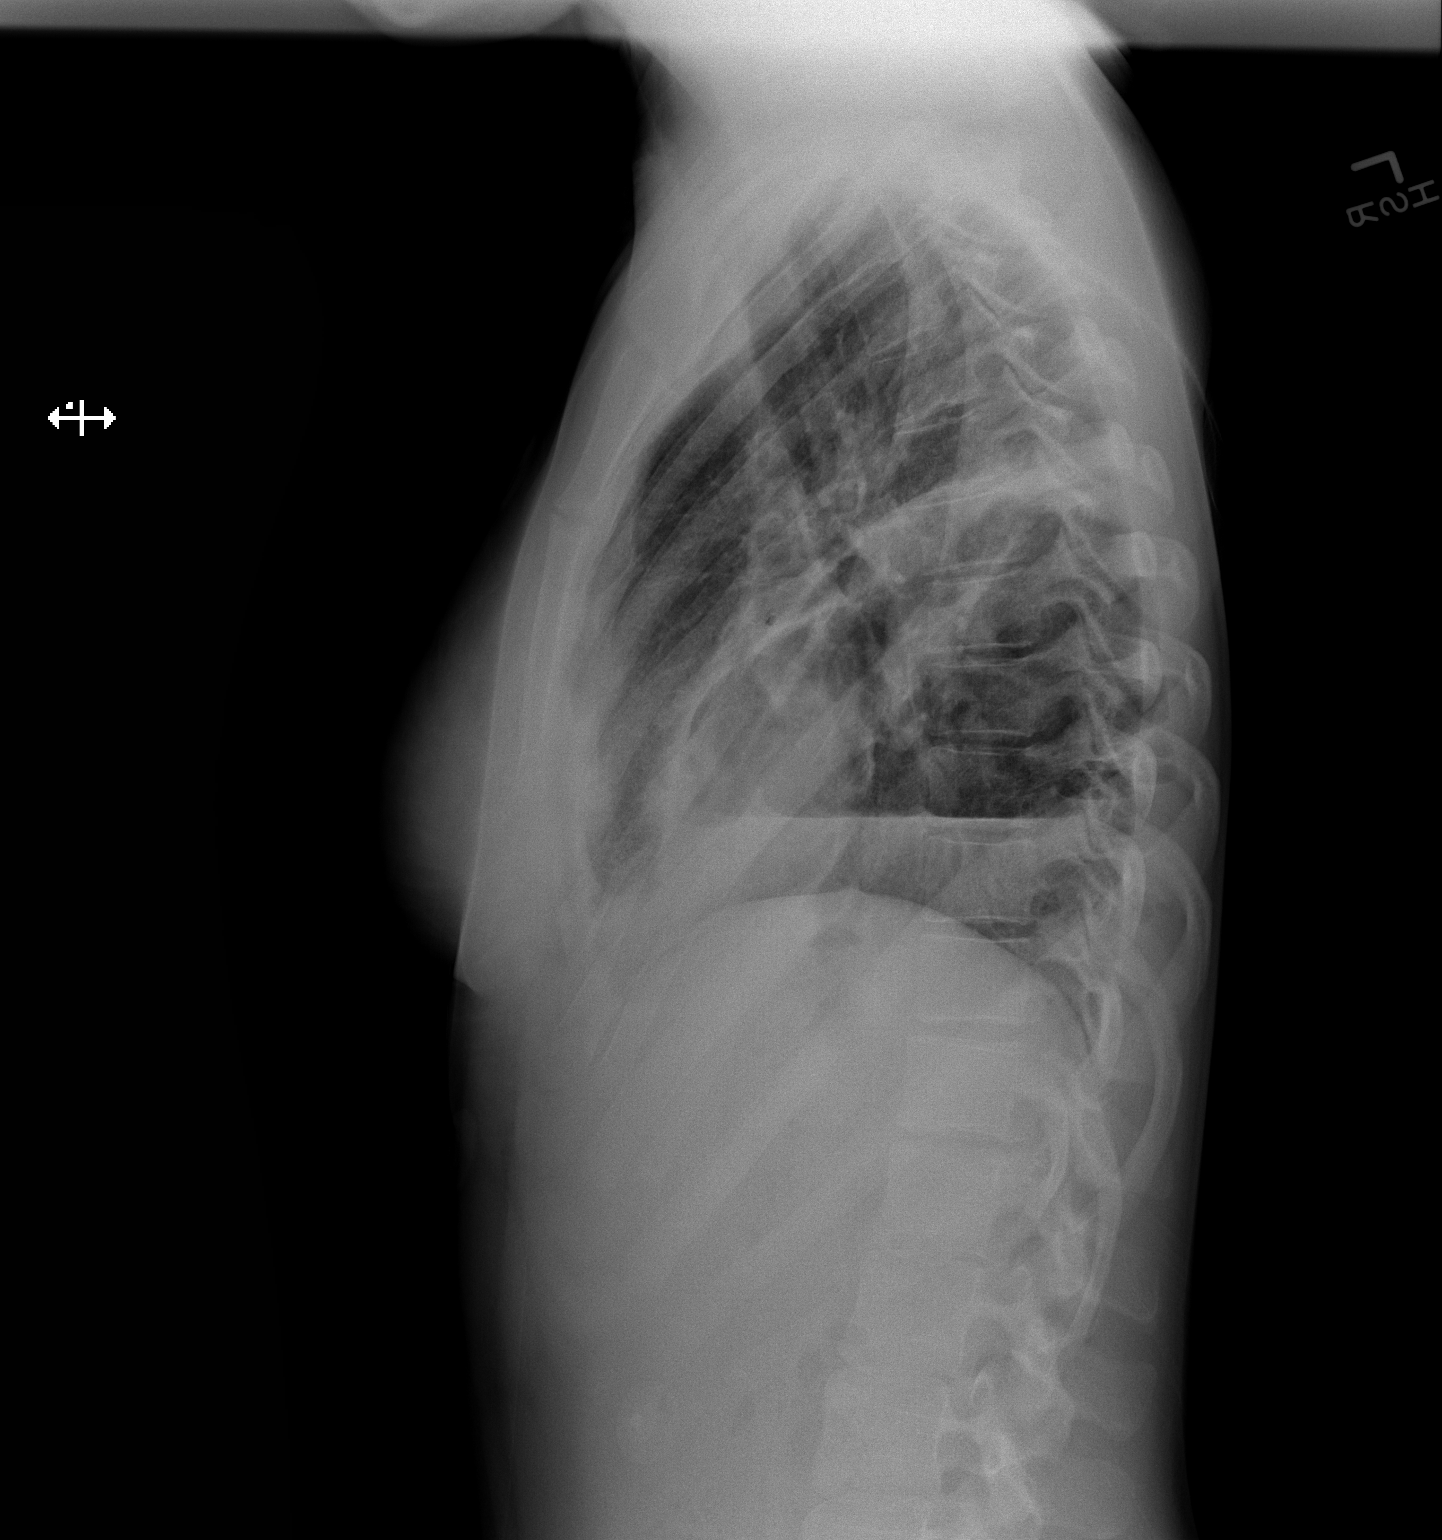

[2 of 2 positions shown; findings below may reference images not displayed]

FINDINGS: Cardiac shadows within normal limits. The left lung is well aerated
and clear. Right lung demonstrates a large cavitary lesion in the
right lower lobe with air-fluid level identified. Some adjacent
atelectatic changes are noted. No pneumothorax is seen. No bony
abnormality is noted.
IMPRESSION: Changes most consistent with a cavitary pneumonia/abscess. CT with
contrast is recommended for further delineation.

## 2021-03-09 IMAGING — CT CT ANGIO CHEST
2 of 6 series · 18 of 36 positions shown · IV contrast (omnipaque)
Comparison: Chest x-ray 11/01/2019

CLINICAL DATA: Chest pain

EXAM:
CT ANGIOGRAPHY CHEST WITH CONTRAST
TECHNIQUE: Multidetector CT imaging of the chest was performed using the
standard protocol during bolus administration of intravenous
contrast. Multiplanar CT image reconstructions and MIPs were
obtained to evaluate the vascular anatomy.
CONTRAST:  60mL OMNIPAQUE IOHEXOL 350 MG/ML SOLN

[Series 7: pe thins · axial · 0.62mm/px · z∈[+958,+1189]mm · 17 of 369 slices shown]
[im 19/369  lung]
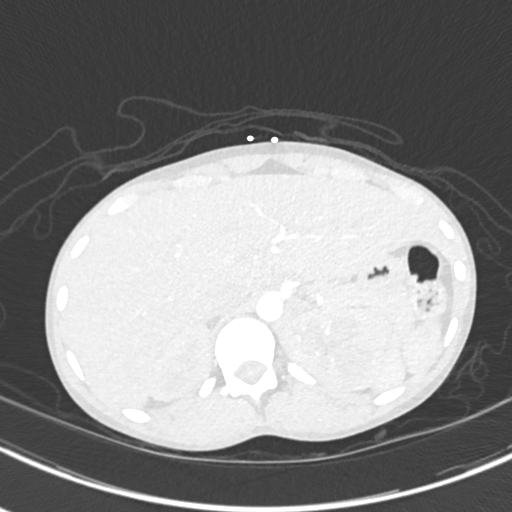
[im 37/369  mediastinal]
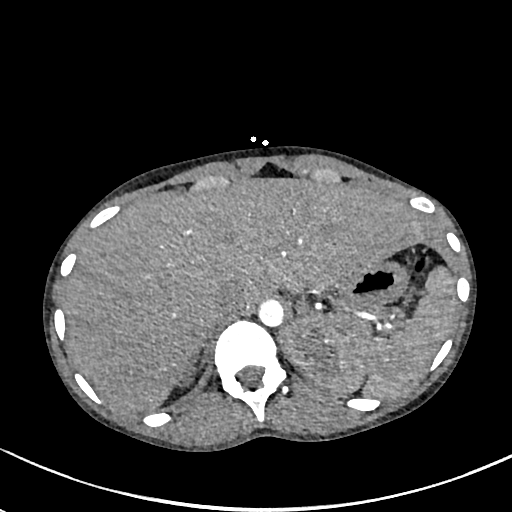
[im 56/369  lung]
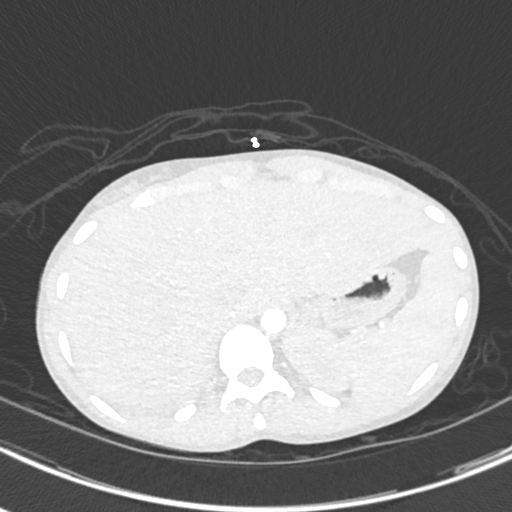
[im 74/369  mediastinal]
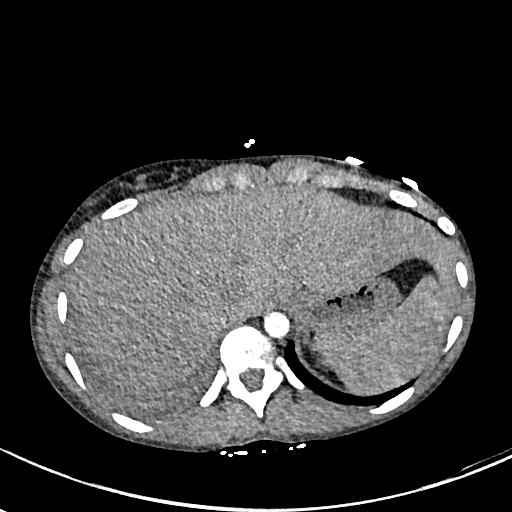
[im 111/369  lung]
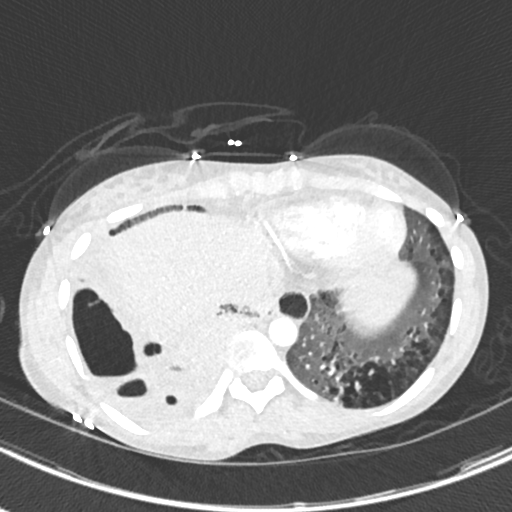
[im 129/369  mediastinal]
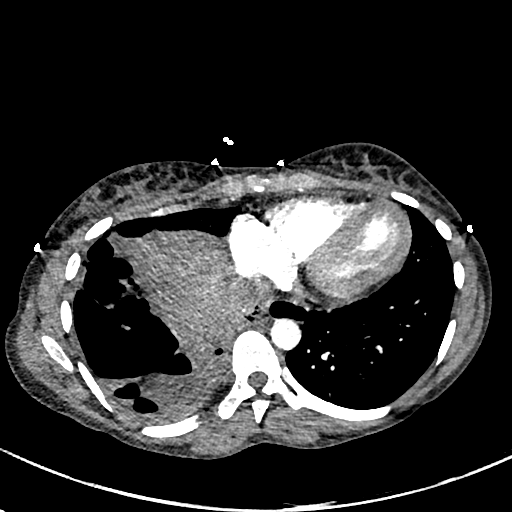
[im 148/369  lung]
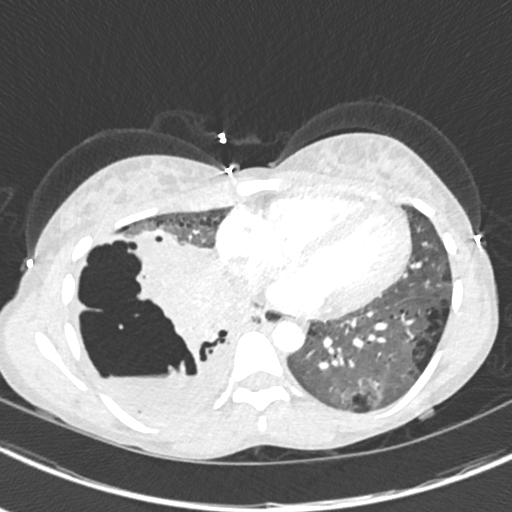
[im 166/369  mediastinal]
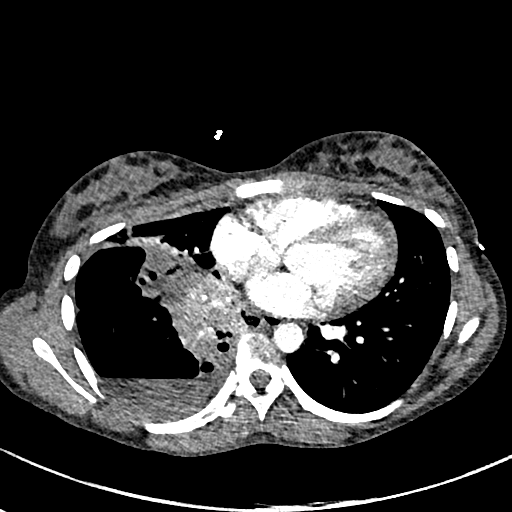
[im 185/369  lung]
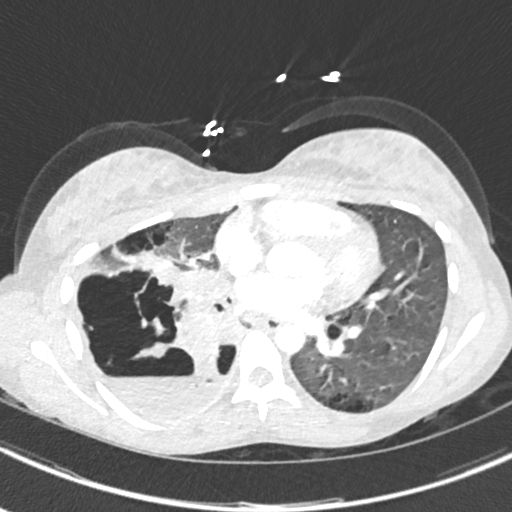
[im 203/369  mediastinal]
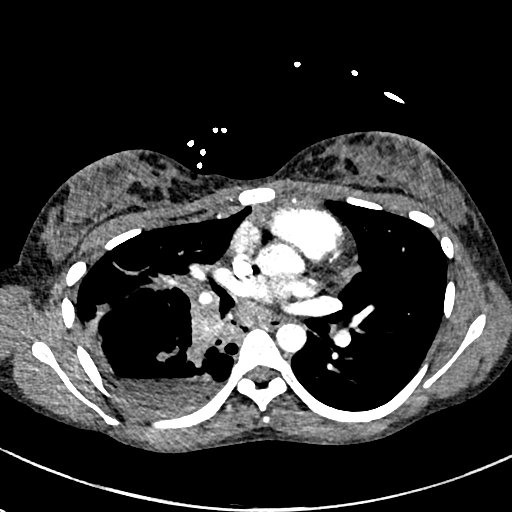
[im 221/369  lung]
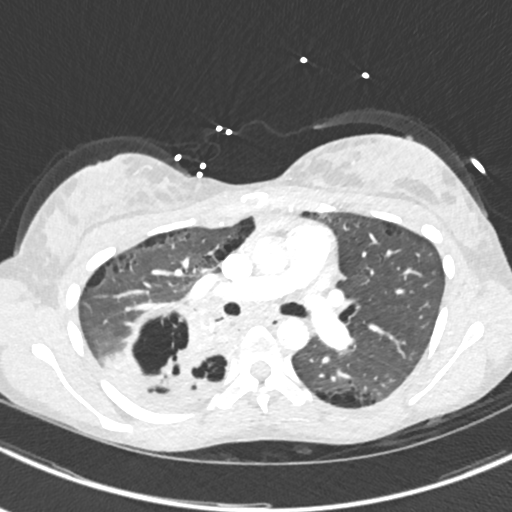
[im 240/369  mediastinal]
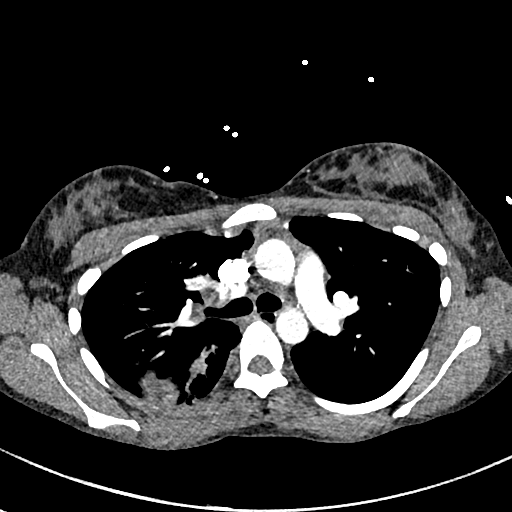
[im 258/369  lung]
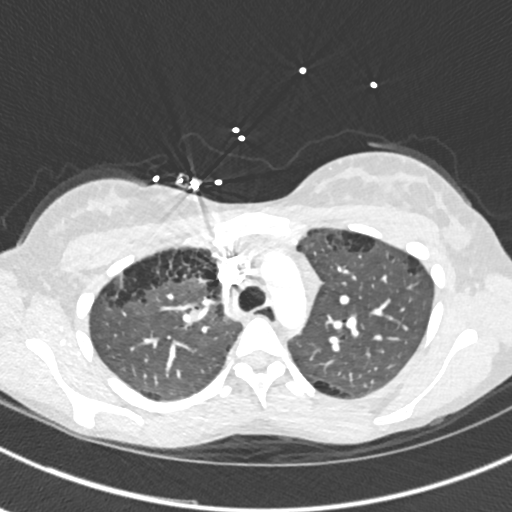
[im 295/369  mediastinal]
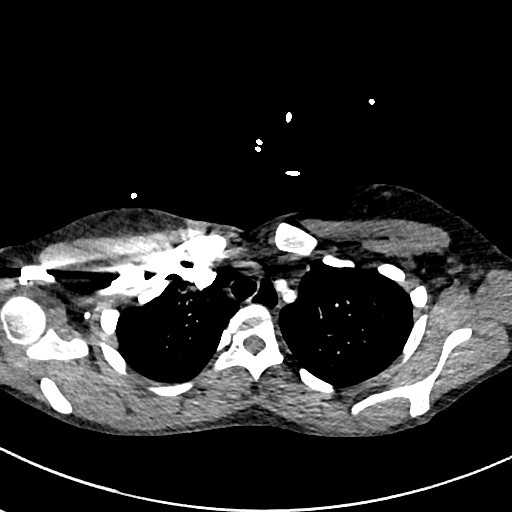
[im 313/369  lung]
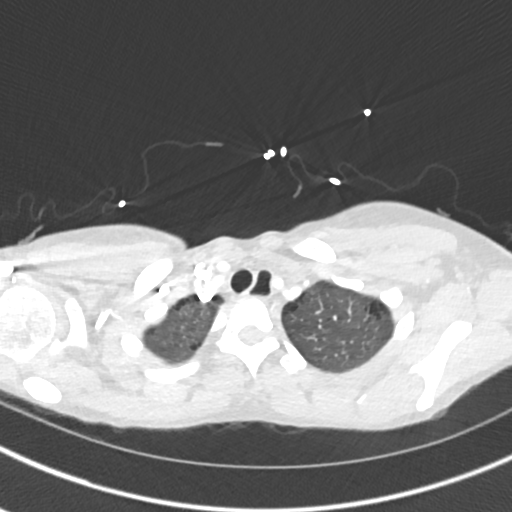
[im 332/369  mediastinal]
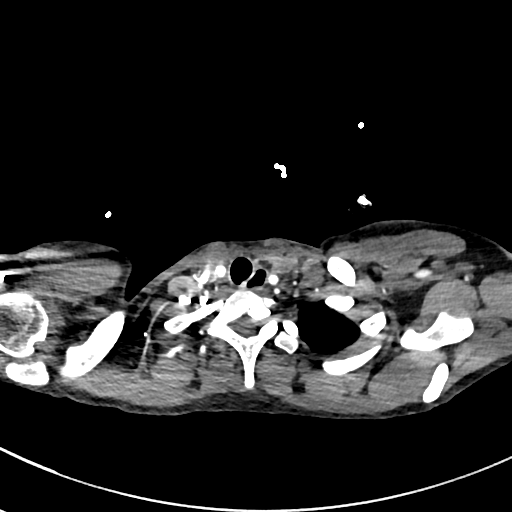
[im 350/369  lung]
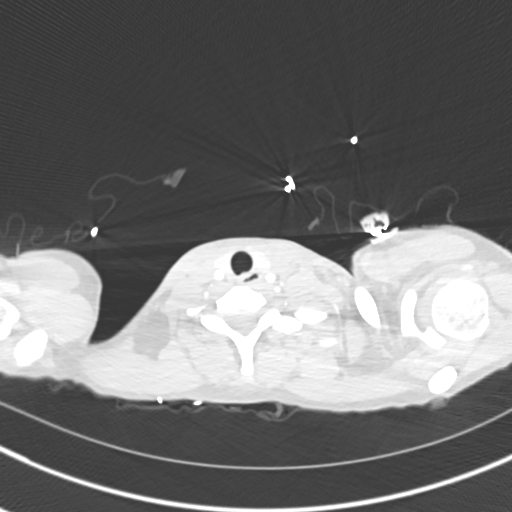

[Series 8: pe 2mm cor · coronal · 0.59mm/px · 1 of 84 slices shown]
[im 42/84  mediastinal]
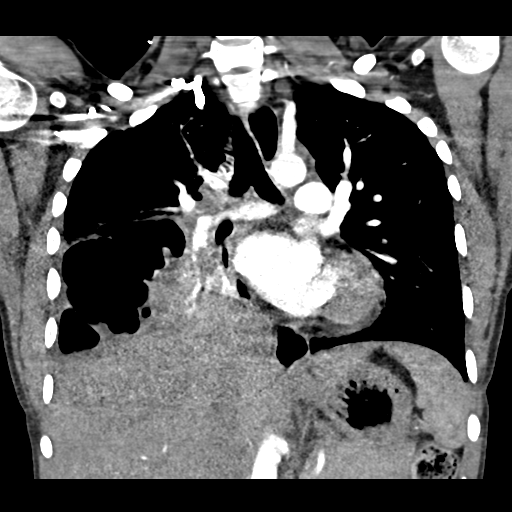

[18 of 36 positions shown; findings below may reference images not displayed]

FINDINGS: Cardiovascular: Satisfactory opacification of the pulmonary arteries
to the segmental level. No definite acute pulmonary embolus is
visualized. Hypoenhancement of distal right pulmonary vessels due to
lung consolidation and mass effect from large cavitary process.
Aorta is nonaneurysmal. Cardiac size is normal.

Mediastinum/Nodes: Midline trachea. No thyroid mass. Probable right
hilar adenopathy measuring up to 12 mm. Subcarinal node measuring up
to 15 mm.

Lungs/Pleura: Underlying emphysematous disease throughout the lungs.
Large cavitary process involving the majority of the right lower
lobe and much of the right middle lobe with relative sparing of the
right upper lobe. Consolidated lung parenchyma in the right middle
and lower lobes. Multiple fluid levels are noted. Internal thickened
septa within the right lower lobe cavitary process. There is
probable small amount of right pleural effusion

Upper Abdomen: No acute abnormality.

Musculoskeletal: No chest wall abnormality. No acute or significant
osseous findings.

Review of the MIP images confirms the above findings.
IMPRESSION: 1. No definite CT evidence for acute pulmonary embolus.
2. Large cavitary process involving majority of the right lower lobe
and most of the right middle lobe. There are multiple internal fluid
levels within the cavitary process which appears to localize to the
lung parenchyma as opposed to the pleural space. Suspect necrotizing
pneumonia with probable pulmonary abscess. There is dense
consolidation within the right middle and lower lobes.
3. The remaining aerated lung parenchyma is abnormal with presence
of emphysematous disease. Consider alpha-2 antitrypsin deficiency
given patient age and presence of extensive emphysema.
4. Right hilar and mediastinal adenopathy is likely reactive

## 2021-03-11 IMAGING — US US RENAL
1 series · 14 of 25 positions shown · non-contrast
Comparison: None.

CLINICAL DATA: Acute kidney injury.

EXAM:
RENAL / URINARY TRACT ULTRASOUND COMPLETE

[Series 1: us renal · 14 of 38 slices shown]
[im 1/38]
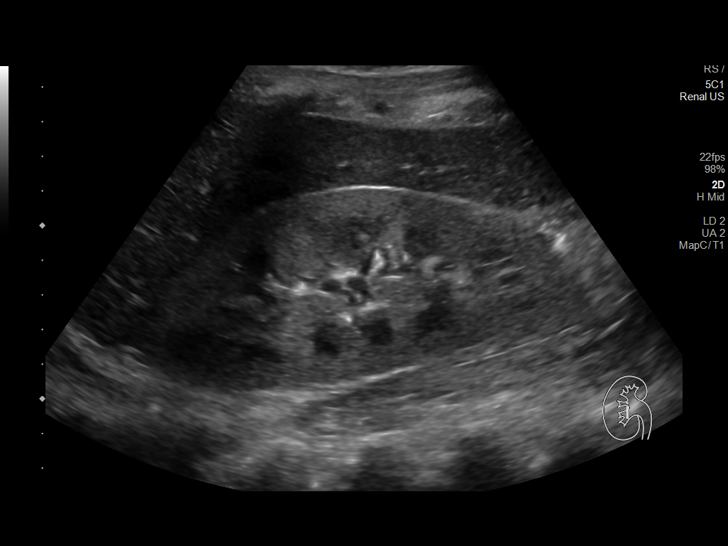
[im 4/38]
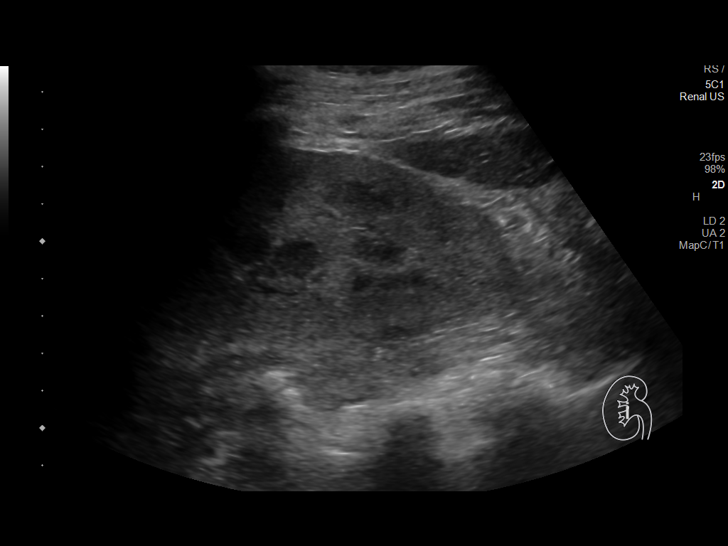
[im 7/38]
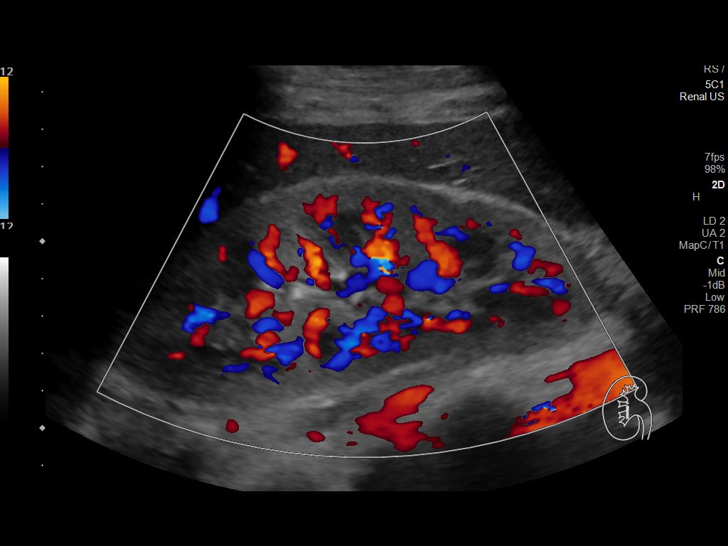
[im 10/38]
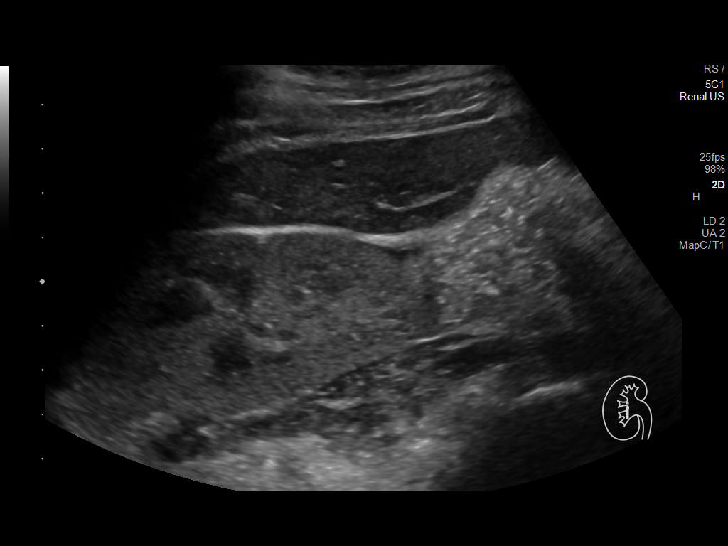
[im 13/38]
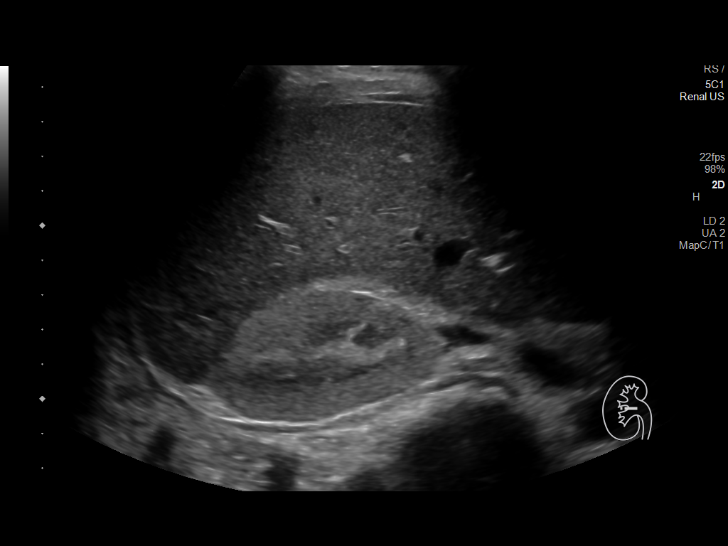
[im 14/38]
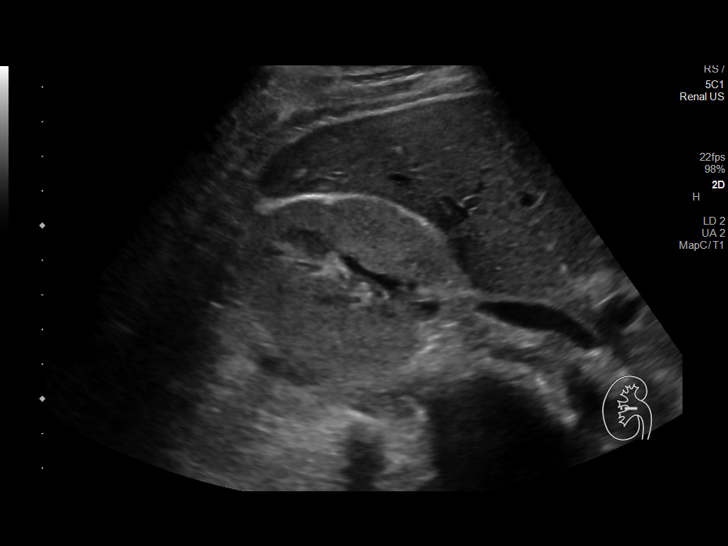
[im 17/38]
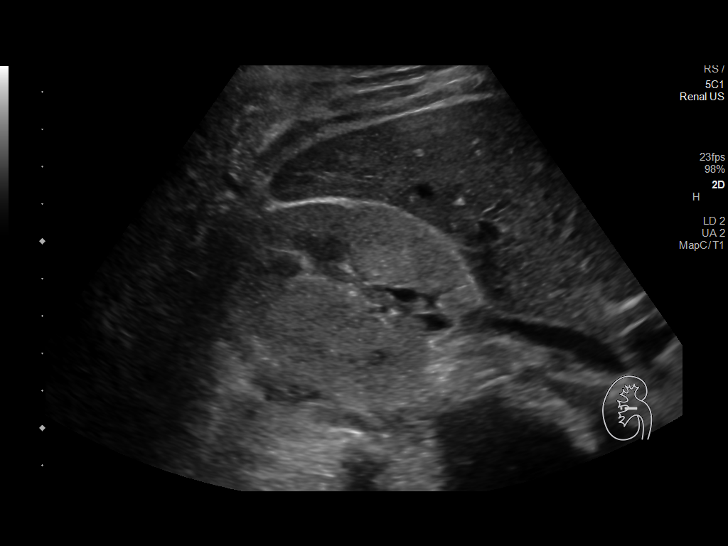
[im 21/38]
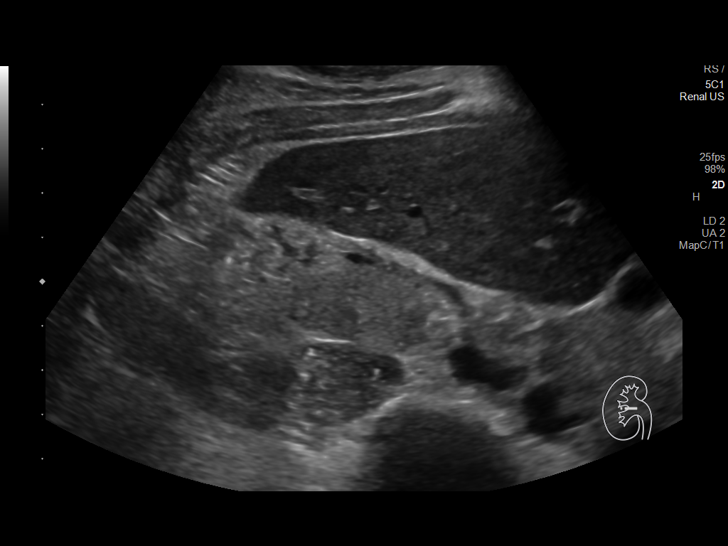
[im 24/38]
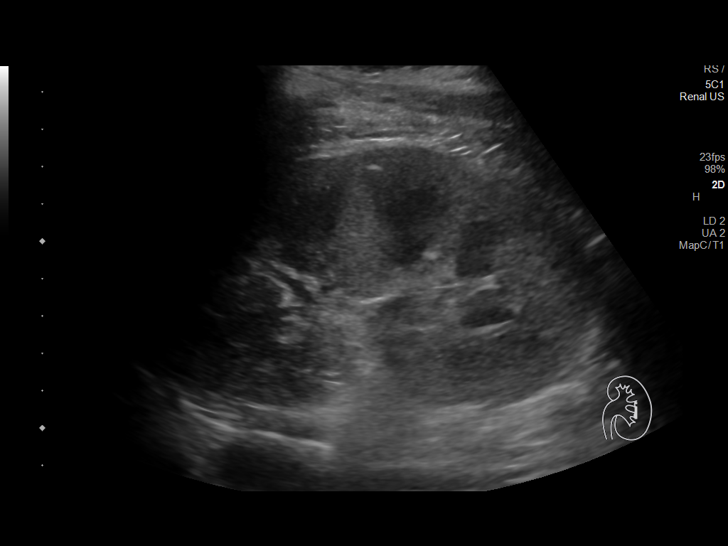
[im 25/38]
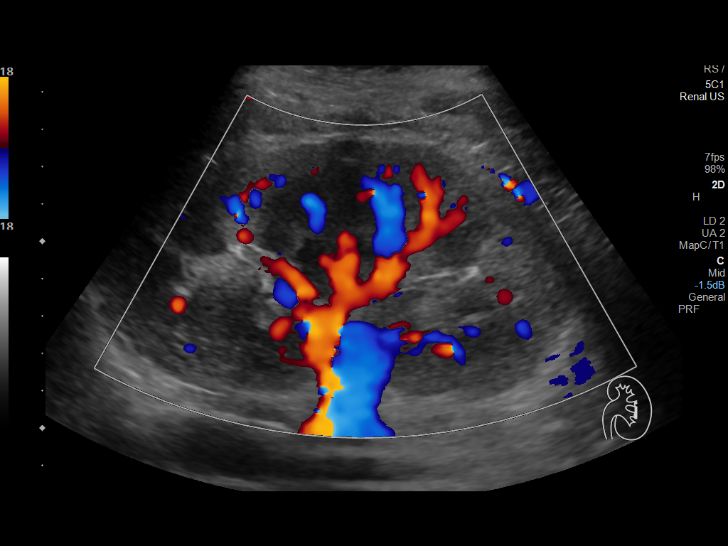
[im 28/38]
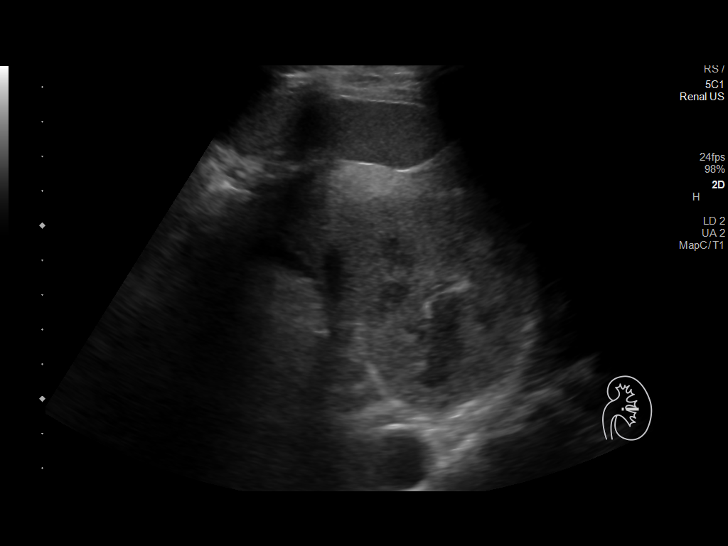
[im 31/38]
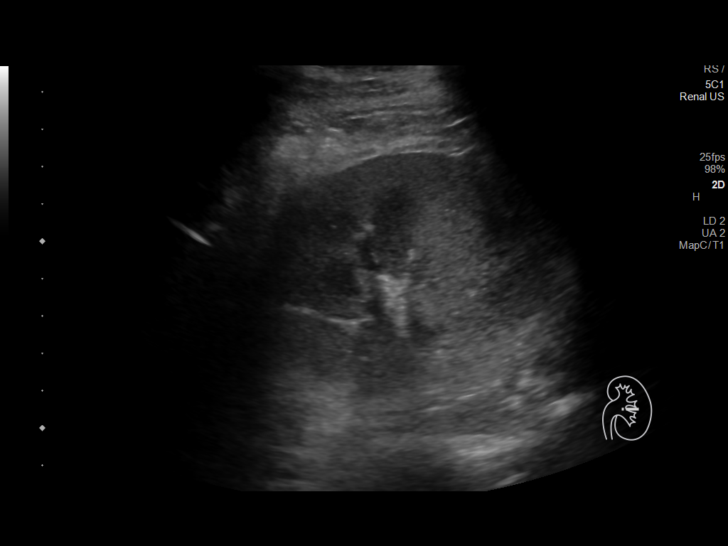
[im 34/38]
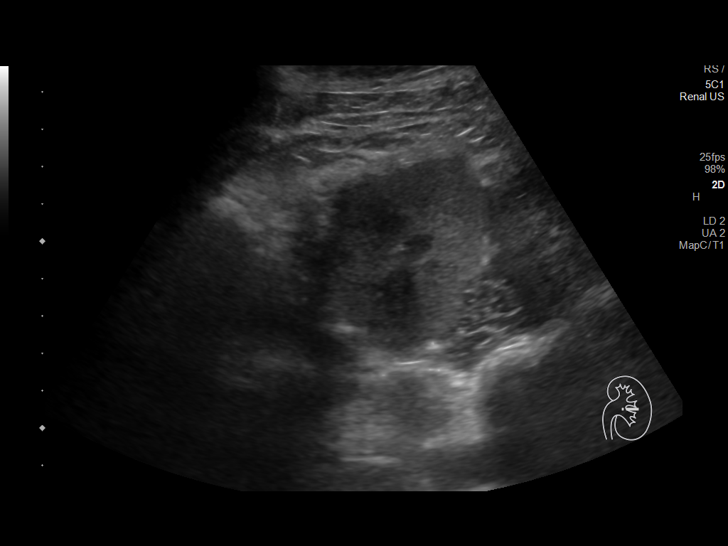
[im 38/38]
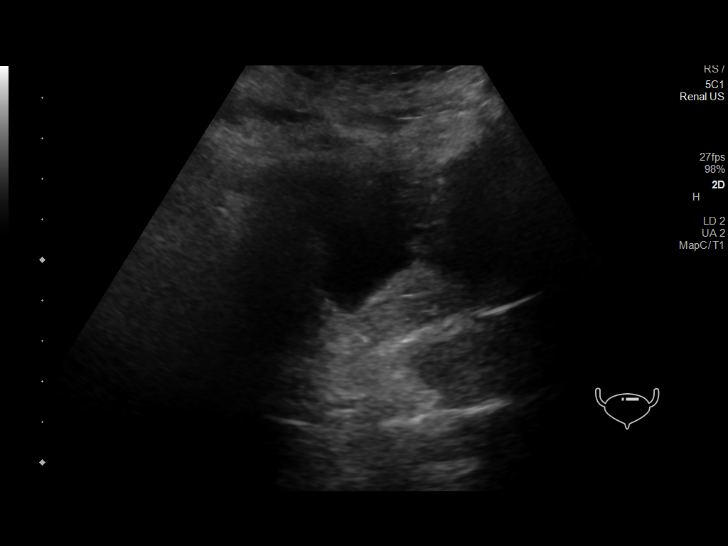

[14 of 25 positions shown; findings below may reference images not displayed]

FINDINGS: Right Kidney:

Renal measurements: 13.1 x 5.6 x 6.4 cm = volume: 248 mL. Increased
echogenicity of renal parenchyma is noted suggesting medical renal
disease. No mass or hydronephrosis visualized.

Left Kidney:

Renal measurements: 13.0 x 7.0 x 6.5 cm = volume: 305 mL. Increased
echogenicity renal parenchyma is noted suggesting medical renal
disease. No mass or hydronephrosis visualized.

Bladder:

Appears normal for degree of bladder distention.

Other:

None.
IMPRESSION: Increased echogenicity of renal parenchyma is noted bilaterally
suggesting medical renal disease. No hydronephrosis or renal
obstruction is noted.

## 2021-03-14 IMAGING — CR DG ABD PORTABLE 1V
1 series · 1 of 1 positions shown · non-contrast
Comparison: None.

CLINICAL DATA: Flank pain

EXAM:
PORTABLE ABDOMEN - 1 VIEW

[abdomen kub]
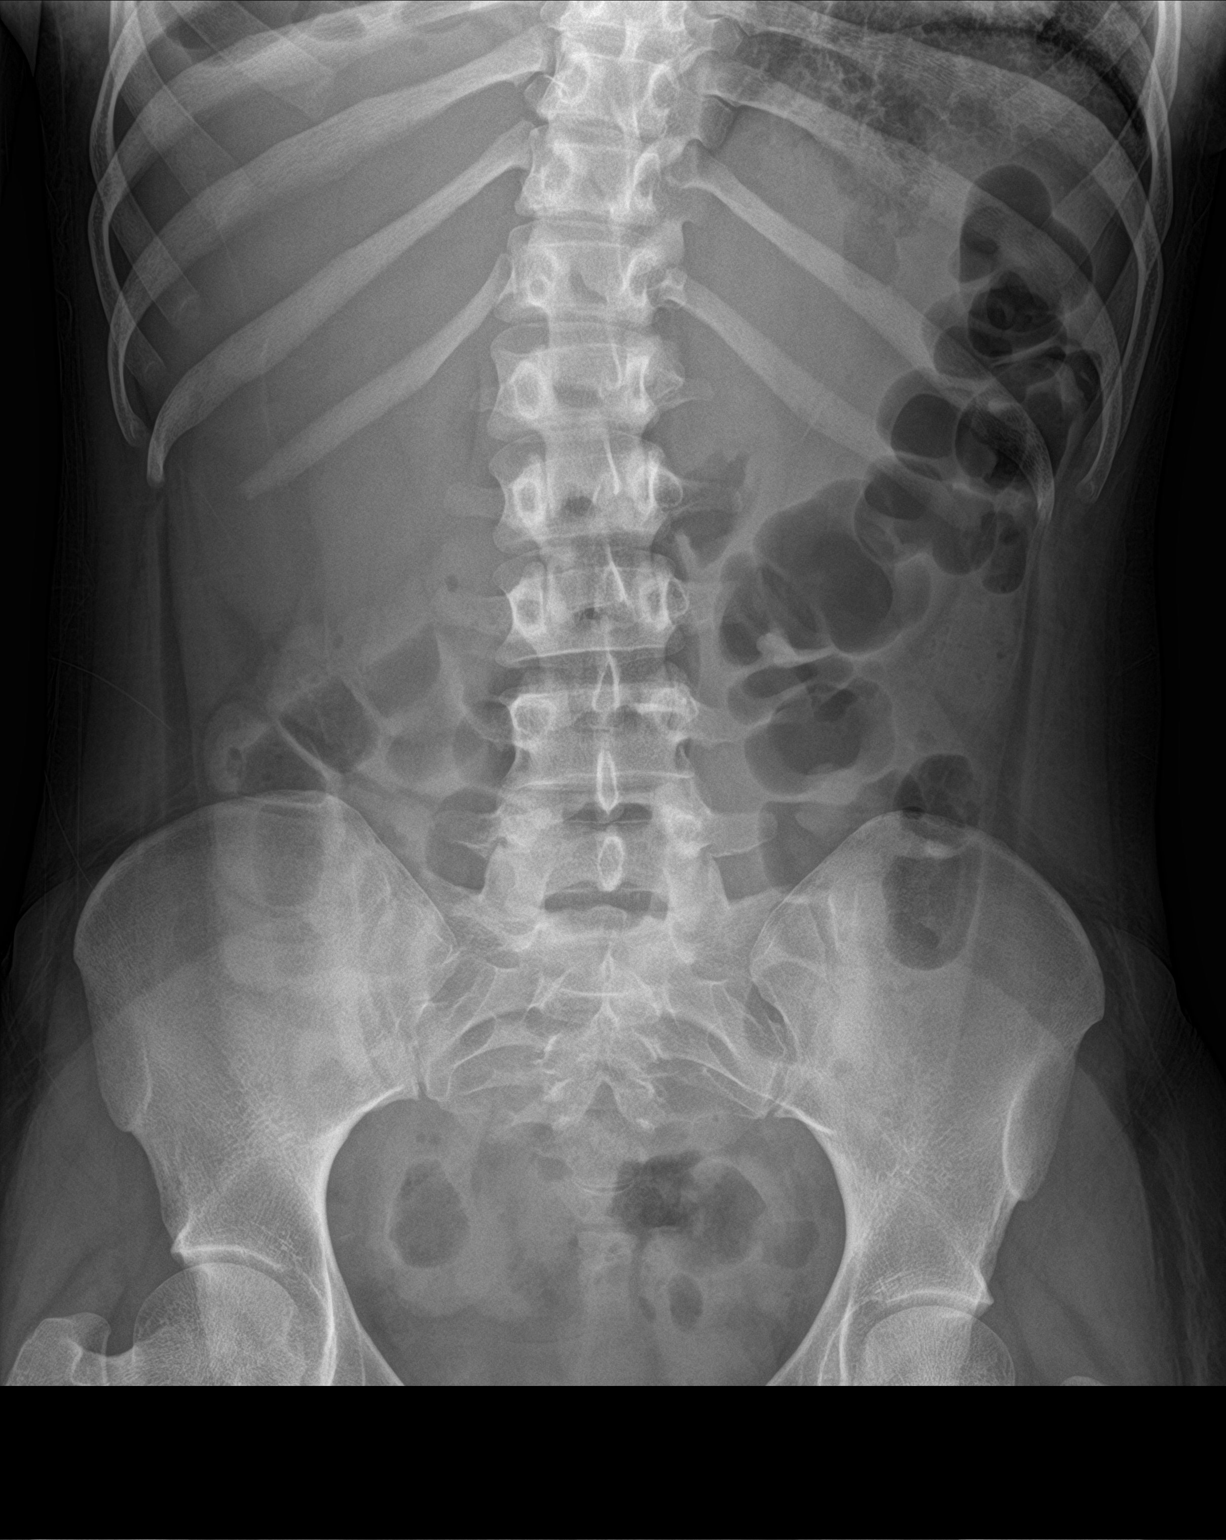

[1 of 1 positions shown; findings below may reference images not displayed]

FINDINGS: No dilated small bowel loops. Mild colonic stool and gas. No
evidence of pneumatosis or pneumoperitoneum. No radiopaque
nephrolithiasis.
IMPRESSION: Nonobstructive bowel gas pattern.

## 2021-05-20 IMAGING — CT CT CHEST W/O CM
2 of 4 series · 15 of 36 positions shown, 18 images · non-contrast
Comparison: 11/25/2019 chest radiograph.  CT 11/02/2019

CLINICAL DATA: Follow-up of necrotizing pneumonia diagnosed in
[REDACTED] with hospitalization. Never smoker. Scleroderma.

EXAM:
CT CHEST WITHOUT CONTRAST
TECHNIQUE: Multidetector CT imaging of the chest was performed following the
standard protocol without IV contrast.

[Series 2: thorax · axial · 0.61mm/px · z∈[-246,-12]mm · 12 of 139 slices shown, 15 images]
[im 11/139  mediastinal]
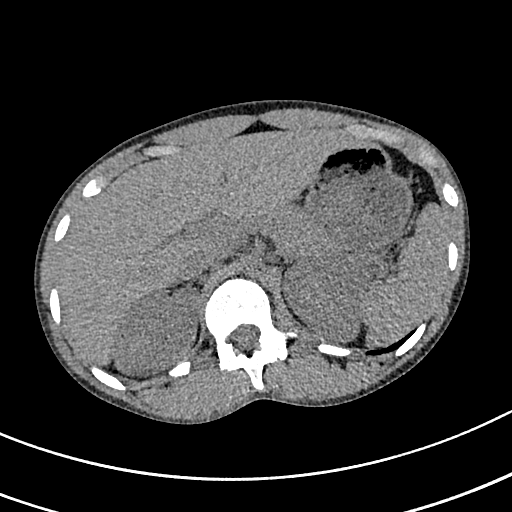
[im 11/139  lung]
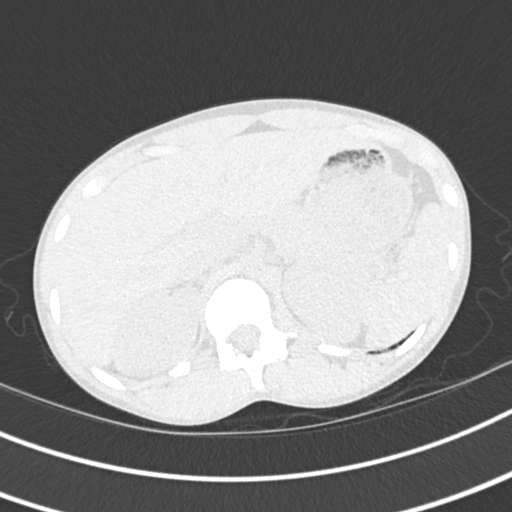
[im 22/139  lung]
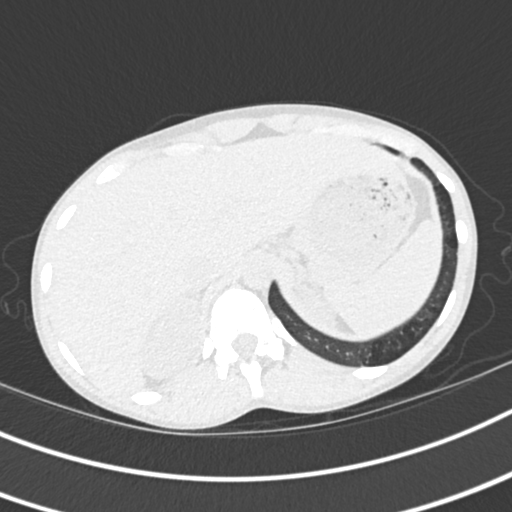
[im 32/139  lung]
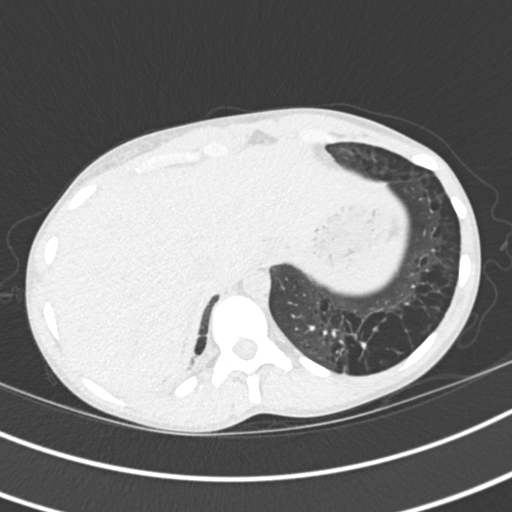
[im 43/139  lung]
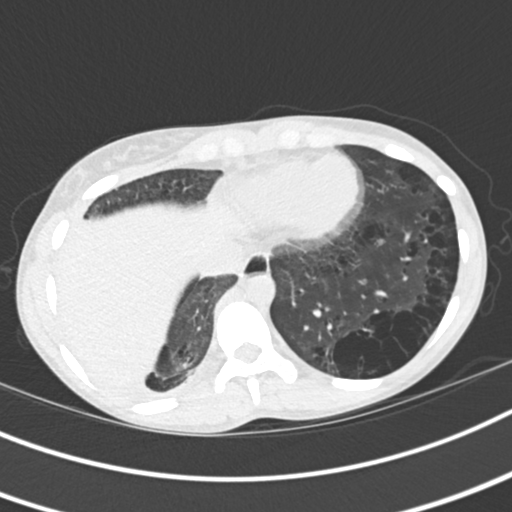
[im 54/139  mediastinal]
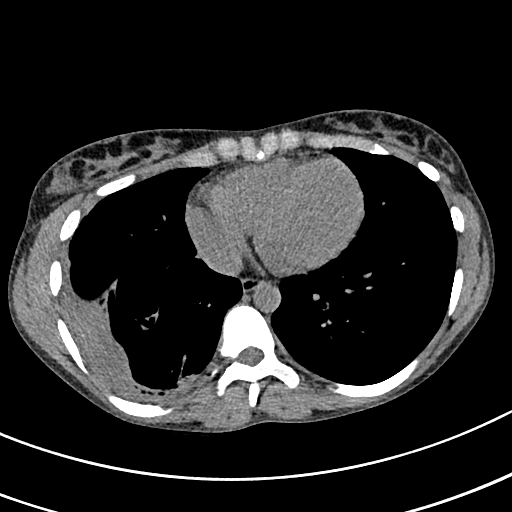
[im 54/139  lung]
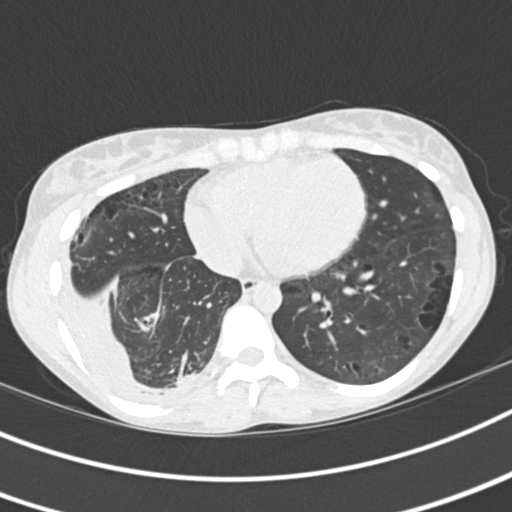
[im 64/139  lung]
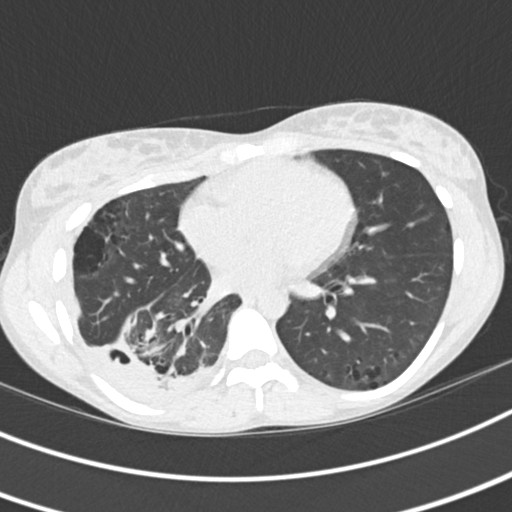
[im 75/139  lung]
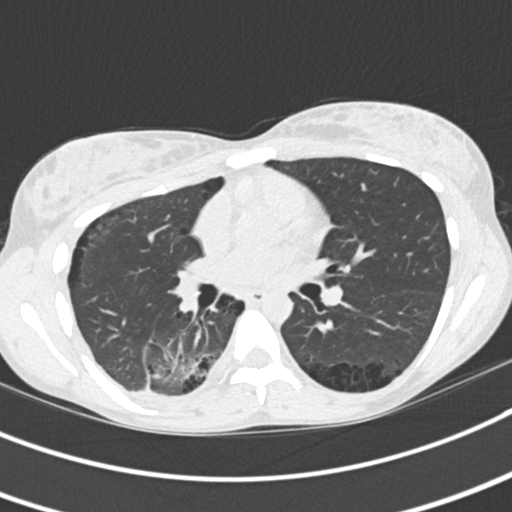
[im 85/139  lung]
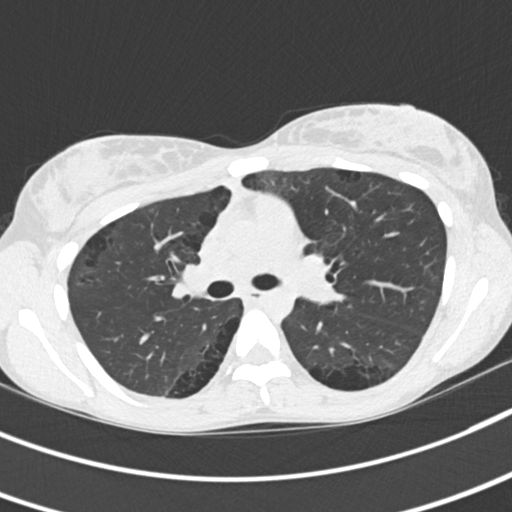
[im 96/139  mediastinal]
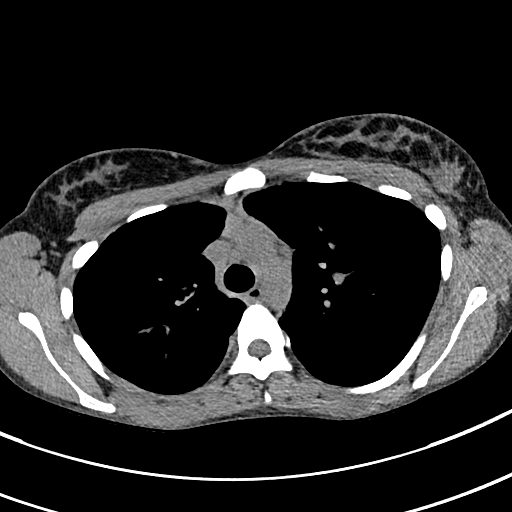
[im 96/139  lung]
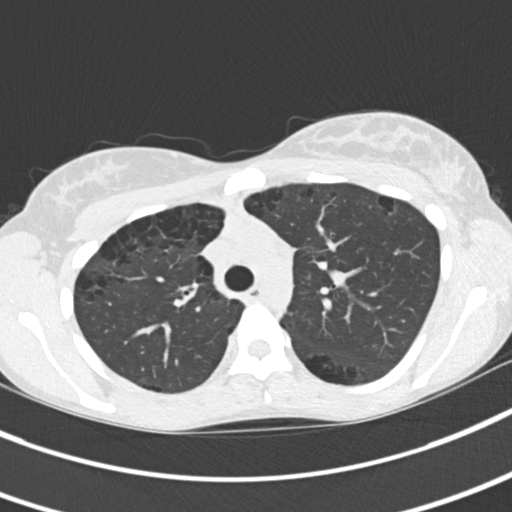
[im 107/139  lung]
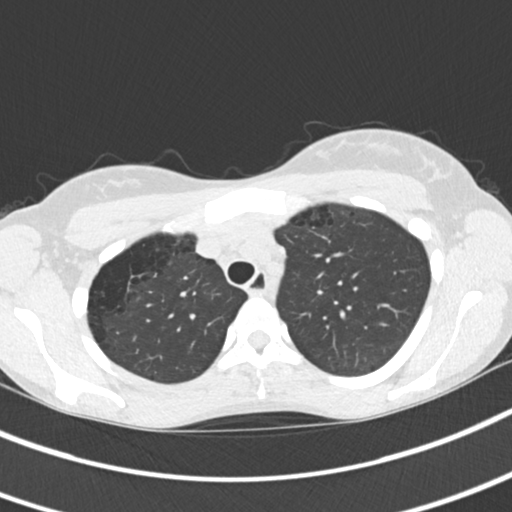
[im 117/139  lung]
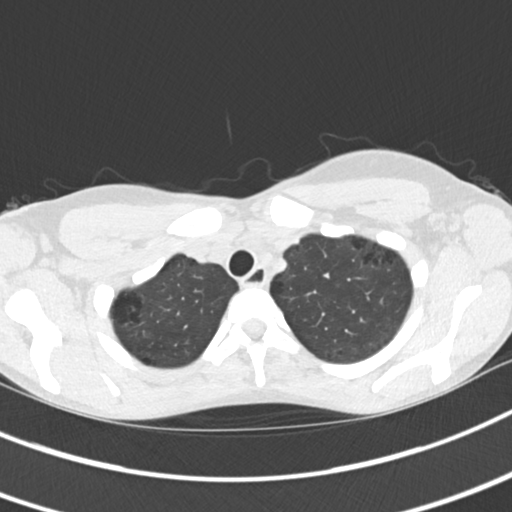
[im 128/139  lung]
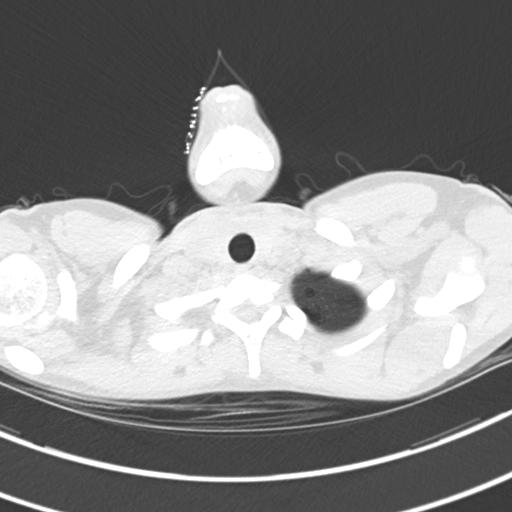

[Series 5: coronal · coronal · 0.56mm/px · 3 of 98 slices shown]
[im 20/98  lung]
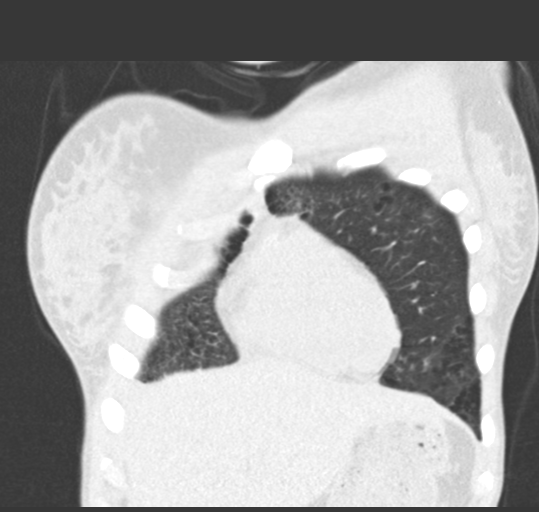
[im 39/98  lung]
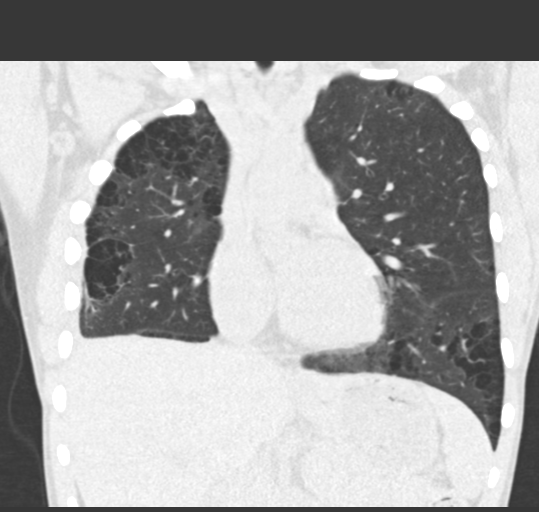
[im 59/98  lung]
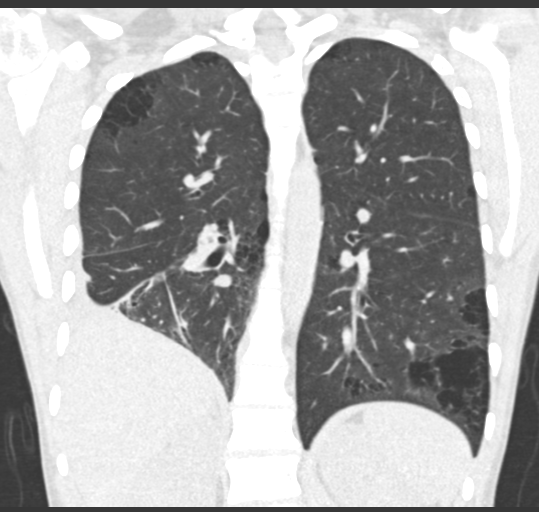

[15 of 36 positions shown; findings below may reference images not displayed]

FINDINGS: Cardiovascular: Normal aortic caliber. Normal heart size, without
pericardial effusion.

Mediastinum/Nodes: No mediastinal or definite hilar adenopathy,
given limitations of unenhanced CT.

Esophageal fluid level on [DATE].

Lungs/Pleura: Minimal right pleural thickening and fluid, decreased.

Extensive paraseptal emphysema.

Significantly improved right-sided aeration. The right middle lobe
consolidation has resolved. There is mild residual peripheral right
lower lobe consolidation with small volume extraalveolar air
including on 79/3. This is favored to be within the right pleural
space. There may be trace more medial and inferior right pleural air
on 85/3.

Upper Abdomen: Normal imaged portions of the liver, spleen, stomach,
pancreas, adrenal glands, kidneys.

Musculoskeletal: No acute osseous abnormality.
IMPRESSION: 1. Significantly improved right-sided aeration. Resolved right
middle lobe consolidation with only mild posterior right lower lobe
airspace disease (residual pneumonia and/or developing scar)
remaining. Small volume, significantly increased extraalveolar air,
slightly favored to represent loculated hydropneumothorax over
residual cavitary pneumonia.
2. No new or progressive infection.
3. Paraseptal emphysema.
4. Esophageal air fluid level suggests dysmotility or
gastroesophageal reflux.
